# Patient Record
Sex: Male | Born: 1945
Health system: Southern US, Community
[De-identification: ages and names within clinical notes are randomized; demographics above are authoritative.]

## PROBLEM LIST (undated history)

## (undated) DIAGNOSIS — F32A Depression, unspecified: Secondary | ICD-10-CM

## (undated) DIAGNOSIS — M353 Polymyalgia rheumatica: Secondary | ICD-10-CM

## (undated) DIAGNOSIS — E785 Hyperlipidemia, unspecified: Secondary | ICD-10-CM

## (undated) DIAGNOSIS — I10 Essential (primary) hypertension: Secondary | ICD-10-CM

---

## 2017-02-09 DIAGNOSIS — H35033 Hypertensive retinopathy, bilateral: Secondary | ICD-10-CM | POA: Diagnosis not present

## 2017-04-24 DIAGNOSIS — Z23 Encounter for immunization: Secondary | ICD-10-CM | POA: Diagnosis not present

## 2017-08-30 DIAGNOSIS — M9901 Segmental and somatic dysfunction of cervical region: Secondary | ICD-10-CM | POA: Diagnosis not present

## 2017-08-30 DIAGNOSIS — M50322 Other cervical disc degeneration at C5-C6 level: Secondary | ICD-10-CM | POA: Diagnosis not present

## 2017-08-30 DIAGNOSIS — M9902 Segmental and somatic dysfunction of thoracic region: Secondary | ICD-10-CM | POA: Diagnosis not present

## 2017-08-30 DIAGNOSIS — M40292 Other kyphosis, cervical region: Secondary | ICD-10-CM | POA: Diagnosis not present

## 2017-08-31 DIAGNOSIS — M9901 Segmental and somatic dysfunction of cervical region: Secondary | ICD-10-CM | POA: Diagnosis not present

## 2017-08-31 DIAGNOSIS — M50322 Other cervical disc degeneration at C5-C6 level: Secondary | ICD-10-CM | POA: Diagnosis not present

## 2017-08-31 DIAGNOSIS — M40292 Other kyphosis, cervical region: Secondary | ICD-10-CM | POA: Diagnosis not present

## 2017-08-31 DIAGNOSIS — M9902 Segmental and somatic dysfunction of thoracic region: Secondary | ICD-10-CM | POA: Diagnosis not present

## 2017-09-02 DIAGNOSIS — M9901 Segmental and somatic dysfunction of cervical region: Secondary | ICD-10-CM | POA: Diagnosis not present

## 2017-09-02 DIAGNOSIS — M50322 Other cervical disc degeneration at C5-C6 level: Secondary | ICD-10-CM | POA: Diagnosis not present

## 2017-09-02 DIAGNOSIS — M40292 Other kyphosis, cervical region: Secondary | ICD-10-CM | POA: Diagnosis not present

## 2017-09-02 DIAGNOSIS — M9902 Segmental and somatic dysfunction of thoracic region: Secondary | ICD-10-CM | POA: Diagnosis not present

## 2017-09-06 DIAGNOSIS — M50322 Other cervical disc degeneration at C5-C6 level: Secondary | ICD-10-CM | POA: Diagnosis not present

## 2017-09-06 DIAGNOSIS — M9902 Segmental and somatic dysfunction of thoracic region: Secondary | ICD-10-CM | POA: Diagnosis not present

## 2017-09-06 DIAGNOSIS — M9901 Segmental and somatic dysfunction of cervical region: Secondary | ICD-10-CM | POA: Diagnosis not present

## 2017-09-06 DIAGNOSIS — M40292 Other kyphosis, cervical region: Secondary | ICD-10-CM | POA: Diagnosis not present

## 2017-09-07 DIAGNOSIS — M9902 Segmental and somatic dysfunction of thoracic region: Secondary | ICD-10-CM | POA: Diagnosis not present

## 2017-09-07 DIAGNOSIS — M9901 Segmental and somatic dysfunction of cervical region: Secondary | ICD-10-CM | POA: Diagnosis not present

## 2017-09-07 DIAGNOSIS — M50322 Other cervical disc degeneration at C5-C6 level: Secondary | ICD-10-CM | POA: Diagnosis not present

## 2017-09-07 DIAGNOSIS — M40292 Other kyphosis, cervical region: Secondary | ICD-10-CM | POA: Diagnosis not present

## 2017-09-09 DIAGNOSIS — M9901 Segmental and somatic dysfunction of cervical region: Secondary | ICD-10-CM | POA: Diagnosis not present

## 2017-09-09 DIAGNOSIS — M50322 Other cervical disc degeneration at C5-C6 level: Secondary | ICD-10-CM | POA: Diagnosis not present

## 2017-09-09 DIAGNOSIS — M9902 Segmental and somatic dysfunction of thoracic region: Secondary | ICD-10-CM | POA: Diagnosis not present

## 2017-09-09 DIAGNOSIS — M40292 Other kyphosis, cervical region: Secondary | ICD-10-CM | POA: Diagnosis not present

## 2017-09-13 DIAGNOSIS — M9902 Segmental and somatic dysfunction of thoracic region: Secondary | ICD-10-CM | POA: Diagnosis not present

## 2017-09-13 DIAGNOSIS — M50322 Other cervical disc degeneration at C5-C6 level: Secondary | ICD-10-CM | POA: Diagnosis not present

## 2017-09-13 DIAGNOSIS — M9901 Segmental and somatic dysfunction of cervical region: Secondary | ICD-10-CM | POA: Diagnosis not present

## 2017-09-13 DIAGNOSIS — M40292 Other kyphosis, cervical region: Secondary | ICD-10-CM | POA: Diagnosis not present

## 2017-09-15 DIAGNOSIS — M9901 Segmental and somatic dysfunction of cervical region: Secondary | ICD-10-CM | POA: Diagnosis not present

## 2017-09-15 DIAGNOSIS — M9902 Segmental and somatic dysfunction of thoracic region: Secondary | ICD-10-CM | POA: Diagnosis not present

## 2017-09-15 DIAGNOSIS — M50322 Other cervical disc degeneration at C5-C6 level: Secondary | ICD-10-CM | POA: Diagnosis not present

## 2017-09-15 DIAGNOSIS — M40292 Other kyphosis, cervical region: Secondary | ICD-10-CM | POA: Diagnosis not present

## 2017-09-16 DIAGNOSIS — M9902 Segmental and somatic dysfunction of thoracic region: Secondary | ICD-10-CM | POA: Diagnosis not present

## 2017-09-16 DIAGNOSIS — M9901 Segmental and somatic dysfunction of cervical region: Secondary | ICD-10-CM | POA: Diagnosis not present

## 2017-09-16 DIAGNOSIS — M40292 Other kyphosis, cervical region: Secondary | ICD-10-CM | POA: Diagnosis not present

## 2017-09-16 DIAGNOSIS — M50322 Other cervical disc degeneration at C5-C6 level: Secondary | ICD-10-CM | POA: Diagnosis not present

## 2017-09-20 DIAGNOSIS — M50322 Other cervical disc degeneration at C5-C6 level: Secondary | ICD-10-CM | POA: Diagnosis not present

## 2017-09-20 DIAGNOSIS — M9902 Segmental and somatic dysfunction of thoracic region: Secondary | ICD-10-CM | POA: Diagnosis not present

## 2017-09-20 DIAGNOSIS — M9901 Segmental and somatic dysfunction of cervical region: Secondary | ICD-10-CM | POA: Diagnosis not present

## 2017-09-20 DIAGNOSIS — M40292 Other kyphosis, cervical region: Secondary | ICD-10-CM | POA: Diagnosis not present

## 2017-09-21 DIAGNOSIS — M9901 Segmental and somatic dysfunction of cervical region: Secondary | ICD-10-CM | POA: Diagnosis not present

## 2017-09-21 DIAGNOSIS — M9902 Segmental and somatic dysfunction of thoracic region: Secondary | ICD-10-CM | POA: Diagnosis not present

## 2017-09-21 DIAGNOSIS — M50322 Other cervical disc degeneration at C5-C6 level: Secondary | ICD-10-CM | POA: Diagnosis not present

## 2017-09-21 DIAGNOSIS — M40292 Other kyphosis, cervical region: Secondary | ICD-10-CM | POA: Diagnosis not present

## 2017-09-22 DIAGNOSIS — M9901 Segmental and somatic dysfunction of cervical region: Secondary | ICD-10-CM | POA: Diagnosis not present

## 2017-09-22 DIAGNOSIS — M40292 Other kyphosis, cervical region: Secondary | ICD-10-CM | POA: Diagnosis not present

## 2017-09-22 DIAGNOSIS — M50322 Other cervical disc degeneration at C5-C6 level: Secondary | ICD-10-CM | POA: Diagnosis not present

## 2017-09-22 DIAGNOSIS — M9902 Segmental and somatic dysfunction of thoracic region: Secondary | ICD-10-CM | POA: Diagnosis not present

## 2017-09-29 DIAGNOSIS — M9901 Segmental and somatic dysfunction of cervical region: Secondary | ICD-10-CM | POA: Diagnosis not present

## 2017-09-29 DIAGNOSIS — M50322 Other cervical disc degeneration at C5-C6 level: Secondary | ICD-10-CM | POA: Diagnosis not present

## 2017-09-29 DIAGNOSIS — M9902 Segmental and somatic dysfunction of thoracic region: Secondary | ICD-10-CM | POA: Diagnosis not present

## 2017-09-29 DIAGNOSIS — M40292 Other kyphosis, cervical region: Secondary | ICD-10-CM | POA: Diagnosis not present

## 2017-10-06 DIAGNOSIS — M40292 Other kyphosis, cervical region: Secondary | ICD-10-CM | POA: Diagnosis not present

## 2017-10-06 DIAGNOSIS — M9901 Segmental and somatic dysfunction of cervical region: Secondary | ICD-10-CM | POA: Diagnosis not present

## 2017-10-06 DIAGNOSIS — M50322 Other cervical disc degeneration at C5-C6 level: Secondary | ICD-10-CM | POA: Diagnosis not present

## 2017-10-06 DIAGNOSIS — M9902 Segmental and somatic dysfunction of thoracic region: Secondary | ICD-10-CM | POA: Diagnosis not present

## 2017-10-12 DIAGNOSIS — M40292 Other kyphosis, cervical region: Secondary | ICD-10-CM | POA: Diagnosis not present

## 2017-10-12 DIAGNOSIS — M50322 Other cervical disc degeneration at C5-C6 level: Secondary | ICD-10-CM | POA: Diagnosis not present

## 2017-10-12 DIAGNOSIS — M9902 Segmental and somatic dysfunction of thoracic region: Secondary | ICD-10-CM | POA: Diagnosis not present

## 2017-10-12 DIAGNOSIS — M9901 Segmental and somatic dysfunction of cervical region: Secondary | ICD-10-CM | POA: Diagnosis not present

## 2017-11-08 DIAGNOSIS — F419 Anxiety disorder, unspecified: Secondary | ICD-10-CM | POA: Diagnosis not present

## 2017-11-08 DIAGNOSIS — E7800 Pure hypercholesterolemia, unspecified: Secondary | ICD-10-CM | POA: Diagnosis not present

## 2017-11-08 DIAGNOSIS — I10 Essential (primary) hypertension: Secondary | ICD-10-CM | POA: Diagnosis not present

## 2018-01-13 DIAGNOSIS — H35033 Hypertensive retinopathy, bilateral: Secondary | ICD-10-CM | POA: Diagnosis not present

## 2018-01-13 DIAGNOSIS — H2513 Age-related nuclear cataract, bilateral: Secondary | ICD-10-CM | POA: Diagnosis not present

## 2018-01-13 DIAGNOSIS — H35371 Puckering of macula, right eye: Secondary | ICD-10-CM | POA: Diagnosis not present

## 2018-01-13 DIAGNOSIS — H25013 Cortical age-related cataract, bilateral: Secondary | ICD-10-CM | POA: Diagnosis not present

## 2018-04-15 DIAGNOSIS — Z23 Encounter for immunization: Secondary | ICD-10-CM | POA: Diagnosis not present

## 2018-05-04 DIAGNOSIS — M67911 Unspecified disorder of synovium and tendon, right shoulder: Secondary | ICD-10-CM | POA: Diagnosis not present

## 2018-05-04 DIAGNOSIS — R29898 Other symptoms and signs involving the musculoskeletal system: Secondary | ICD-10-CM | POA: Diagnosis not present

## 2018-05-10 DIAGNOSIS — I10 Essential (primary) hypertension: Secondary | ICD-10-CM | POA: Diagnosis not present

## 2018-05-10 DIAGNOSIS — E7800 Pure hypercholesterolemia, unspecified: Secondary | ICD-10-CM | POA: Diagnosis not present

## 2018-05-10 DIAGNOSIS — R29898 Other symptoms and signs involving the musculoskeletal system: Secondary | ICD-10-CM | POA: Diagnosis not present

## 2018-05-10 DIAGNOSIS — F419 Anxiety disorder, unspecified: Secondary | ICD-10-CM | POA: Diagnosis not present

## 2018-05-10 DIAGNOSIS — H35033 Hypertensive retinopathy, bilateral: Secondary | ICD-10-CM | POA: Diagnosis not present

## 2018-05-10 DIAGNOSIS — M67911 Unspecified disorder of synovium and tendon, right shoulder: Secondary | ICD-10-CM | POA: Diagnosis not present

## 2018-05-10 DIAGNOSIS — Z79899 Other long term (current) drug therapy: Secondary | ICD-10-CM | POA: Diagnosis not present

## 2018-05-10 DIAGNOSIS — Z125 Encounter for screening for malignant neoplasm of prostate: Secondary | ICD-10-CM | POA: Diagnosis not present

## 2018-05-10 DIAGNOSIS — Z0001 Encounter for general adult medical examination with abnormal findings: Secondary | ICD-10-CM | POA: Diagnosis not present

## 2018-05-10 DIAGNOSIS — Z23 Encounter for immunization: Secondary | ICD-10-CM | POA: Diagnosis not present

## 2018-05-18 ENCOUNTER — Other Ambulatory Visit: Payer: Self-pay

## 2018-05-18 ENCOUNTER — Encounter: Payer: Self-pay | Admitting: Physical Therapy

## 2018-05-18 ENCOUNTER — Ambulatory Visit: Payer: Medicare Other | Attending: Family Medicine | Admitting: Physical Therapy

## 2018-05-18 DIAGNOSIS — M25512 Pain in left shoulder: Secondary | ICD-10-CM | POA: Diagnosis not present

## 2018-05-18 DIAGNOSIS — M25511 Pain in right shoulder: Secondary | ICD-10-CM | POA: Diagnosis not present

## 2018-05-18 DIAGNOSIS — M6281 Muscle weakness (generalized): Secondary | ICD-10-CM | POA: Diagnosis not present

## 2018-05-18 NOTE — Therapy (Signed)
[facility] [address] [place], [place], [address] Phone: [phone]   Fax:  [phone]  Physical Therapy Evaluation  Patient Details  Name: Devin Riggs MRN: [id number] Date of Birth: 04/07/1946 Referring Provider (PT): Darrow Bussing, MD   Encounter Date: 05/18/2018  PT End of Session - 05/18/18 1423    Visit Number  1    Date for PT Re-Evaluation  07/13/18    Authorization Type  Medicare [employer] supplement    Authorization Time Period  05/18/18-07/13/18    PT Start Time  1147    PT Stop Time  1230    PT Time Calculation (min)  43 min    Activity Tolerance  Patient tolerated treatment well    Behavior During Therapy  [facility] for tasks assessed/performed       History reviewed. No pertinent past medical history.  History reviewed. No pertinent surgical history.  There were no vitals filed for this visit.   Subjective Assessment - 05/18/18 1159    Subjective  Pt comes to PT with report of sudden onset of bil shoulder and leg stiffness and weakness beginning Oct 2019.  Prior to onset he has been very active with exercise at a gym.  Pt feels the stiffness is progressing.  Stiffness is worse with prolonged sitting > 15 min.  Attempts to stand from sitting are difficult due to stiffness and takes awhile to transition into comfortable walking.  Pt also reports difficulty with dressing due to shoulder stiffness.  Pt has arthitis in both hands and notes the Rt hand has been getting more stiff.  Pt finds some relief with moist heat via a hot shower, whirlpool which gives temporary relief, and ibuprofen.    Pertinent History  Lt shoulder dislocations many years ago    Limitations  Sitting;Lifting;Standing;Walking;House hold activities   initial walking from sitting   How long can you sit comfortably?  15 min    Diagnostic tests  none    Patient Stated Goals  understand what is going on, reduce stiffness, get  stronger, return to exercise with confidence    Currently in Pain?  Yes    Pain Score  4    can be a 10, and sharp   Pain Location  Shoulder    Pain Orientation  Left;Right    Pain Descriptors / Indicators  Tightness;Sharp    Pain Type  Acute pain    Pain Onset  More than a month ago    Pain Frequency  Intermittent    Aggravating Factors   overhead reaching, dressing    Pain Relieving Factors  moist heat, meds    Effect of Pain on Daily Activities  dressing, can't exercise    Multiple Pain Sites  Yes    Pain Score  5    Pain Location  --   bil thighs   Pain Orientation  Right;Left;Posterior;Medial    Pain Descriptors / Indicators  Tightness   weakness   Pain Type  Acute pain    Pain Onset  More than a month ago    Pain Frequency  Intermittent    Aggravating Factors   every time he rises from bed or a chair, especially after prolonged sitting    Pain Relieving Factors  moist heat, ibuprofen    Effect of Pain on Daily Activities  sit to stand, feeling steady on legs, prolonged sitting         [facility] PT Assessment - 05/18/18 0001  Assessment   Medical Diagnosis  R29.898 (ICD-10-CM) - Leg heaviness  M67.911 (ICD-10-CM) - Dysfunction of right rotator cuff    Referring Provider (PT)  Darrow Bussing, MD    Onset Date/Surgical Date  --   Oct 2019   Hand Dominance  Right    Prior Therapy  chiropractor, ongoing      Balance Screen   Has the patient fallen in the past 6 months  Yes    How many times?  1   at a pool   Has the patient had a decrease in activity level because of a fear of falling?   No    Is the patient reluctant to leave their home because of a fear of falling?   No      Home Environment   Living Environment  Private residence    Living Arrangements  Spouse/significant other    Type of Home  House    Home Access  Stairs to enter    Entrance Stairs-Number of Steps  3    Entrance Stairs-Rails  None    Home Layout  Two level    Alternate Level Stairs-Number  of Steps  10    Alternate Level Stairs-Rails  Left    Home Equipment  None      Prior Function   Level of Independence  Independent      Cognition   Overall Cognitive Status  Within Functional Limits for tasks assessed      Observation/Other Assessments   Focus on Therapeutic Outcomes (FOTO)   42   estimated 33%     ROM / Strength   AROM / PROM / Strength  AROM;Strength      AROM   Overall AROM   Within functional limits for tasks performed    Overall AROM Comments  grossly WNL bil LEs    AROM Assessment Site  Shoulder    Right/Left Shoulder  Left;Right    Right Shoulder Extension  5 Degrees   with anterior shoulder pain   Right Shoulder Flexion  170 Degrees   slow   Right Shoulder ABduction  150 Degrees    Right Shoulder Internal Rotation  5 Degrees   cannot perform IR behind back due to sharp ant shoulder pain   Right Shoulder External Rotation  50 Degrees    Left Shoulder Extension  5 Degrees   with anterior shoulder pain   Left Shoulder Flexion  160 Degrees   slow movement due to stiffness   Left Shoulder ABduction  150 Degrees   slow   Left Shoulder Internal Rotation  5 Degrees   cannot perform IR behind back due to sharp shoulder pain   Left Shoulder External Rotation  50 Degrees      Strength   Overall Strength  Within functional limits for tasks performed    Overall Strength Comments  5/5 bil LEs    Strength Assessment Site  Shoulder    Right/Left Shoulder  Right;Left    Right Shoulder Flexion  4+/5    Right Shoulder Extension  4+/5    Right Shoulder ABduction  4/5    Right Shoulder Internal Rotation  4+/5    Right Shoulder External Rotation  4/5    Left Shoulder Flexion  4+/5    Left Shoulder Extension  4/5    Left Shoulder ABduction  4/5    Left Shoulder Internal Rotation  4+/5    Left Shoulder External Rotation  4/5      Flexibility  Soft Tissue Assessment /Muscle Length  yes   hip flexors limited by 10 deg bil, adductors by 10 deg bil    Hamstrings  limited on Rt by 20 deg, Lt by 10 deg    Quadriceps  limited bil by 15 deg      Special Tests    Special Tests  Rotator Cuff Impingement    Rotator Cuff Impingment tests  Empty Can test      Empty Can test   Findings  Positive    Side  --   bil   Comment  pain anterior shoulder bil      Transfers   Transfers  Sit to Stand    Sit to Stand  7: Independent   slow to transition into hip and knee extension bil    Five time sit to stand comments   8      Ambulation/Gait   Ambulation/Gait  Yes    Assistive device  None    Gait Pattern  Step-through pattern    Gait Comments  slow start due to stiff hips/knees   once Pt gets moving no gait deviations present               Objective measurements completed on examination: See above findings.                PT Short Term Goals - 05/18/18 1458      PT SHORT TERM GOAL #1   Title  Pt will be ind in initial HEP to include ROM, shoulder stabilization, LE flexibility and strength.    Time  4    Period  Weeks    Status  New      PT SHORT TERM GOAL #2   Title  Pt will be able to sit at least 30 min with LE pain not to increase above 3/10.    Time  4    Period  Weeks    Status  New    Target Date  06/15/18      PT SHORT TERM GOAL #3   Title  Pt will be able to perform basic ADLs without increase in bil shoulder pain.    Time  4    Period  Weeks    Status  New    Target Date  06/15/18      PT SHORT TERM GOAL #4   Title  Pt will be able to return to at least 15 min of cardiovasular exercise at the gym at least 2x/week without increase in pain.    Time  8    Period  Weeks    Status  New    Target Date  07/13/18      PT SHORT TERM GOAL #5   Title  Pt will achieve 150 degrees bil shoulder flexion with pain rating < or = 3/10 to improve overhead functional task performance.    Time  4    Period  Weeks    Status  New    Target Date  06/15/18        PT Long Term Goals - 05/18/18 1435      PT  LONG TERM GOAL #1   Title  Pt will be ind in advanced HEP to improve strength, ROM and functional independence with less pain.    Time  8    Period  Weeks    Status  New    Target Date  07/13/18      PT LONG TERM  GOAL #2   Title  Pt will achieve FOTO score to 33% or less to demonstrate reduced functional limitations.    Time  8    Period  Weeks    Status  New    Target Date  07/13/18      PT LONG TERM GOAL #3   Title  Pt will achieve shoulder extension and functional IR (behind back) to at least 15 degrees to improve UE use for dressing and other ADLs.    Time  8    Period  Weeks    Status  New    Target Date  07/13/18      PT LONG TERM GOAL #4   Title  Pt will achieve LE flexibility to within at least 10 degrees of normal limits bilaterally to reduce stress across hips and knees.    Time  8    Period  Weeks    Status  New    Target Date  07/13/18      PT LONG TERM GOAL #5   Title  Pt will be able to tolerate return to at least 30 min of walking or cardiovascular exercise without increase in pain.    Time  8    Period  Weeks    Status  New    Target Date  07/13/18             Plan - 05/18/18 1424    Clinical Impression Statement  Pt presents to PT with concerns over sudden onset of bil shoulder stiffness/pain and unsteadiness on feet approx. 1 month ago.  Prior to onset, Pt reports he was very active and exercised at the gym regularly.  Much of PT eval was spent with Pt describing sudden changes and current state compared to prior to onset.  He has had a significant decrease in activity level due to be concerned about this sudden change.  He is awaiting blood work to be taken by referring MD next week to check for signs of arthritis.  Shoulder pain is sharp and worse with attempts to reach behind back into IR and extension which limits functional tasks such as dressing and ADLs.  Empty can test was positive bil for pain, but Pt is non-tender to palpation at rotator cuff  tendons bil.  Pt has limited bil shoulder ROM and is slow to perform overhead movements due to stiffness.  He has anterior shoulder pain when laying on his back and feels they are "unstable."  History includes several Lt shoulder dislocations 30+ years ago and he is guarded in this shoulder with attempts at P/ROM.  Overall UE strength ranges from 4-4+/5.  Bil LE strength is 5/5 throughout. 5x sit to stand was performed in 8 sec.  Pt has slow transitions from sit to stand into initial gait due to stiffness in bil hips and knees after prolonged sitting.  Flexibility is limited bil in hip flexors, quads, hamstrings Rt>Lt and adductors.  Pt has a history of seeing a chiropractor 1x/month.  Pt will benefit from ongoing assessment given sudden change in activity level, follow up on blood work results, and skilled PT to address ROM, stability of bil shoulders, LE strength and balance, functional training and Pt education on safe return to exercise to address pain and increase functional independence.     History and Personal Factors relevant to plan of care:  History of multiple occurences of Lt shoulder dislocation 30+ years ago, sudden onset global stiffness and unsteadiness on feet  approx 1 month ago    Clinical Presentation  Stable    Clinical Presentation due to:  bil shoulder pain and stiffness, unsteadiness on feet    Clinical Decision Making  Low    Rehab Potential  Good    Clinical Impairments Affecting Rehab Potential  sudden onset stiffness and pain bil shoulders, unsteadiness on feet approx 1 month ago, Pt fear/concern over activity level limitations due to this sudden change    PT Frequency  2x / week    PT Duration  8 weeks    PT Treatment/Interventions  ADLs/Self Care Home Management;Cryotherapy;Electrical Stimulation;Moist Heat;Iontophoresis 4mg /ml Dexamethasone;Traction;Gait training;Stair training;Functional mobility training;Therapeutic activities;Therapeutic exercise;Balance  training;Neuromuscular re-education;Patient/family education;Manual techniques;Passive range of motion;Dry needling;Spinal Manipulations;Joint Manipulations;Taping;Vasopneumatic Device    PT Next Visit Plan  begin HEP, shoulder A/AROM, shoulder stabilization and strength as tolerated, LE strength/ROM/balance/stretches (hams, adductors, hip flexors, quads)    PT Home Exercise Plan  start HEP    Consulted and Agree with Plan of Care  Patient       Patient will benefit from skilled therapeutic intervention in order to improve the following deficits and impairments:  Abnormal gait, Improper body mechanics, Pain, Decreased mobility, Postural dysfunction, Decreased activity tolerance, Decreased range of motion, Decreased strength, Hypomobility, Impaired UE functional use, Decreased balance, Difficulty walking, Impaired flexibility  Visit Diagnosis: Acute pain of left shoulder - Plan: PT plan of care cert/re-cert  Acute pain of right shoulder - Plan: PT plan of care cert/re-cert  Muscle weakness (generalized) - Plan: PT plan of care cert/re-cert     Problem List There are no active problems to display for this patient.  Loistine Simas Beuhring, PT 05/18/18 3:09 PM   [facility] [address], [address] [place], [place], [address] Phone: [phone]   Fax:  [phone]  Name: Devin Riggs MRN: [id number] Date of Birth: 29-Jun-1946

## 2018-05-20 ENCOUNTER — Encounter: Payer: Self-pay | Admitting: Physical Therapy

## 2018-05-20 ENCOUNTER — Ambulatory Visit: Payer: Medicare Other | Admitting: Physical Therapy

## 2018-05-20 DIAGNOSIS — M25512 Pain in left shoulder: Secondary | ICD-10-CM | POA: Diagnosis not present

## 2018-05-20 DIAGNOSIS — M25511 Pain in right shoulder: Secondary | ICD-10-CM

## 2018-05-20 DIAGNOSIS — M6281 Muscle weakness (generalized): Secondary | ICD-10-CM | POA: Diagnosis not present

## 2018-05-20 NOTE — Patient Instructions (Signed)
Access Code: [email or web address]  URL: [email or web address]  Date: 05/20/2018  Prepared by: Lavinia SharpsStacy Cathye Kreiter   Exercises  Supine Shoulder Flexion with Dowel - 10 reps - 1 sets - 5 hold - 1x daily - 7x weekly  Supine Hamstring Stretch with Strap - 13 reps - 1 sets - 30 hold - 1x daily - 7x weekly  Supine Hip External Rotation Stretch - 3 reps - 1 sets - 30 hold - 1x daily - 7x weekly  Bent Knee Fallouts - 3 reps - 1 sets - 30 hold - 1x daily - 7x weekly  Supine Lower Trunk Rotation - 3 reps - 1 sets - 30 hold - 1x daily - 7x weekly  Hip Flexor Stretch at Edge of Bed - 3 reps - 1 sets - 30 hold - 1x daily - 7x weekly  Supine Bridge - 10 reps - 1 sets - 2 hold - 1x daily - 7x weekly

## 2018-05-20 NOTE — Therapy (Signed)
[facility] [address], [address] [place], [place], [address] Phone: [phone]   Fax:  [phone]  Physical Therapy Treatment  Patient Details  Name: Devin Riggs MRN: [id number] Date of Birth: 06/10/1946 Referring Provider (PT): Darrow Bussing, MD   Encounter Date: 05/20/2018  PT End of Session - 05/20/18 1211    Visit Number  2    Date for PT Re-Evaluation  07/13/18    Authorization Type  Medicare [employer] supplement    Authorization Time Period  05/18/18-07/13/18    PT Start Time  1015    PT Stop Time  1058    PT Time Calculation (min)  43 min    Activity Tolerance  Patient tolerated treatment well       History reviewed. No pertinent past medical history.  History reviewed. No pertinent surgical history.  There were no vitals filed for this visit.  Subjective Assessment - 05/20/18 1015    Subjective  Awaiting blood work results Tuesday.  Both shoulders and hips very stiff.  Reaching toward back pocket 5/10 pain.  Pulling up pants is painful.      Currently in Pain?  Yes    Pain Score  5     Pain Location  Shoulder    Pain Orientation  Right    Pain Relieving Factors  moist heat, ibuprofen    Pain Score  5    Pain Location  Hip    Pain Orientation  Right;Left                       [facility] Adult PT Treatment/Exercise - 05/20/18 0001      Knee/Hip Exercises: Stretches   Active Hamstring Stretch  Right;Left;3 reps;30 seconds    Active Hamstring Stretch Limitations  supine with strap    Hip Flexor Stretch  Right;Left;3 reps;30 seconds    Hip Flexor Stretch Limitations  supine over the side of the bed    Piriformis Stretch  Right;Left;2 reps;30 seconds    Other Knee/Hip Stretches  lumbar rotation 3x 30 sec holds    Other Knee/Hip Stretches  bent knee fallouts 3x 30 sec holds      Knee/Hip Exercises: Supine   Bridges  Right;Left;10 reps      Shoulder Exercises: Supine   Flexion   AAROM;Both;10 reps    Flexion Limitations  with cane             PT Education - 05/20/18 1049    Education Details   Access Code: [email or web address] cane flexion; LE stretches    Person(s) Educated  Patient    Methods  Explanation;Demonstration;Handout    Comprehension  Verbalized understanding;Returned demonstration       PT Short Term Goals - 05/18/18 1458      PT SHORT TERM GOAL #1   Title  Pt will be ind in initial HEP to include ROM, shoulder stabilization, LE flexibility and strength.    Time  4    Period  Weeks    Status  New      PT SHORT TERM GOAL #2   Title  Pt will be able to sit at least 30 min with LE pain not to increase above 3/10.    Time  4    Period  Weeks    Status  New    Target Date  06/15/18      PT SHORT TERM GOAL #3   Title  Pt will be able to  perform basic ADLs without increase in bil shoulder pain.    Time  4    Period  Weeks    Status  New    Target Date  06/15/18      PT SHORT TERM GOAL #4   Title  Pt will be able to return to at least 15 min of cardiovasular exercise at the gym at least 2x/week without increase in pain.    Time  8    Period  Weeks    Status  New    Target Date  07/13/18      PT SHORT TERM GOAL #5   Title  Pt will achieve 150 degrees bil shoulder flexion with pain rating < or = 3/10 to improve overhead functional task performance.    Time  4    Period  Weeks    Status  New    Target Date  06/15/18        PT Long Term Goals - 05/18/18 1435      PT LONG TERM GOAL #1   Title  Pt will be ind in advanced HEP to improve strength, ROM and functional independence with less pain.    Time  8    Period  Weeks    Status  New    Target Date  07/13/18      PT LONG TERM GOAL #2   Title  Pt will achieve FOTO score to 33% or less to demonstrate reduced functional limitations.    Time  8    Period  Weeks    Status  New    Target Date  07/13/18      PT LONG TERM GOAL #3   Title  Pt will achieve shoulder extension and  functional IR (behind back) to at least 15 degrees to improve UE use for dressing and other ADLs.    Time  8    Period  Weeks    Status  New    Target Date  07/13/18      PT LONG TERM GOAL #4   Title  Pt will achieve LE flexibility to within at least 10 degrees of normal limits bilaterally to reduce stress across hips and knees.    Time  8    Period  Weeks    Status  New    Target Date  07/13/18      PT LONG TERM GOAL #5   Title  Pt will be able to tolerate return to at least 30 min of walking or cardiovascular exercise without increase in pain.    Time  8    Period  Weeks    Status  New    Target Date  07/13/18            Plan - 05/20/18 1211    Clinical Impression Statement  The patient complains of continued bil shoulder stiffness and hip stiffness but he is particularly concerned about left shoulder instability.  He reports it feels unstable even with lying supine but feels much better with a towel roll behind his shoulder.  Initiated low level mobility ex's and a HEP.  Therapist closely monitoring response and providing verbal cues for technique.  He reports following treatment session that his legs "feel much better,"      Rehab Potential  Good    Clinical Impairments Affecting Rehab Potential  sudden onset stiffness and pain bil shoulders, unsteadiness on feet approx 1 month ago, Pt fear/concern over activity level limitations due to  this sudden change    PT Frequency  2x / week    PT Duration  8 weeks    PT Treatment/Interventions  ADLs/Self Care Home Management;Cryotherapy;Electrical Stimulation;Moist Heat;Iontophoresis 4mg /ml Dexamethasone;Traction;Gait training;Stair training;Functional mobility training;Therapeutic activities;Therapeutic exercise;Balance training;Neuromuscular re-education;Patient/family education;Manual techniques;Passive range of motion;Dry needling;Spinal Manipulations;Joint Manipulations;Taping;Vasopneumatic Device    PT Next Visit Plan  review HEP  as needed;  add shoulder isometrics for left;  add clams and core;  Nu-Step ; towel roll behind shoulder in supine    PT Home Exercise Plan  [email or web address]       Patient will benefit from skilled therapeutic intervention in order to improve the following deficits and impairments:  Abnormal gait, Improper body mechanics, Pain, Decreased mobility, Postural dysfunction, Decreased activity tolerance, Decreased range of motion, Decreased strength, Hypomobility, Impaired UE functional use, Decreased balance, Difficulty walking, Impaired flexibility  Visit Diagnosis: Acute pain of left shoulder  Acute pain of right shoulder  Muscle weakness (generalized)     Problem List There are no active problems to display for this patient.  Lavinia Sharps, PT 05/20/18 12:20 PM Phone: [phone] Fax: [phone]  Vivien Presto 05/20/2018, 12:20 PM   [facility] [address], [address] [place], [place], [address] Phone: [phone]   Fax:  [phone]  Name: Tomer Chalmers MRN: [id number] Date of Birth: [birth year]

## 2018-05-23 DIAGNOSIS — R972 Elevated prostate specific antigen [PSA]: Secondary | ICD-10-CM | POA: Diagnosis not present

## 2018-05-23 DIAGNOSIS — M25559 Pain in unspecified hip: Secondary | ICD-10-CM | POA: Diagnosis not present

## 2018-05-24 ENCOUNTER — Encounter: Payer: Self-pay | Admitting: Physical Therapy

## 2018-05-24 ENCOUNTER — Ambulatory Visit: Payer: Medicare Other | Admitting: Physical Therapy

## 2018-05-24 DIAGNOSIS — M25512 Pain in left shoulder: Secondary | ICD-10-CM | POA: Diagnosis not present

## 2018-05-24 DIAGNOSIS — M25511 Pain in right shoulder: Secondary | ICD-10-CM | POA: Diagnosis not present

## 2018-05-24 DIAGNOSIS — M6281 Muscle weakness (generalized): Secondary | ICD-10-CM | POA: Diagnosis not present

## 2018-05-24 NOTE — Patient Instructions (Signed)
Access Code: [email or web address]  URL: [email or web address]  Date: 05/24/2018  Prepared by: Lavinia SharpsStacy Teliah Buffalo   Exercises  Supine Shoulder Flexion with Dowel - 10 reps - 1 sets - 5 hold - 1x daily - 7x weekly  Supine Hamstring Stretch with Strap - 13 reps - 1 sets - 30 hold - 1x daily - 7x weekly  Supine Hip External Rotation Stretch - 3 reps - 1 sets - 30 hold - 1x daily - 7x weekly  Bent Knee Fallouts - 3 reps - 1 sets - 30 hold - 1x daily - 7x weekly  Supine Lower Trunk Rotation - 3 reps - 1 sets - 30 hold - 1x daily - 7x weekly  Hip Flexor Stretch at Edge of Bed - 3 reps - 1 sets - 30 hold - 1x daily - 7x weekly  Supine Bridge - 10 reps - 1 sets - 2 hold - 1x daily - 7x weekly  Hooklying Transversus Abdominis Palpation - 10 reps - 1 sets - 1x daily - 7x weekly  Hooklying Isometric Hip Flexion - 10 reps - 1 sets - 1x daily - 7x weekly  Supine 90/90 Shoulder Flexion with Abdominal Bracing - 10 reps - 1 sets - 1x daily - 7x weekly  Clamshell with Resistance - 10 reps - 1 sets - 1x daily - 7x weekly  Standing Isometric Shoulder Flexion with Doorway - 10 reps - 1 sets - 5 hold - 1x daily - 7x weekly  Isometric Shoulder Abduction - Arm Straight at Wall - 10 reps - 1 sets - 5 hold - 1x daily - 7x weekly  Standing Isometric Shoulder Extension with Doorway - 10 reps - 1 sets - 1x daily - 7x weekly

## 2018-05-24 NOTE — Therapy (Signed)
[facility] [address], [address] [place], [place], [address] Phone: [phone]   Fax:  [phone]  Physical Therapy Treatment  Patient Details  Name: Devin BeneMiddleton Bonifield MRN: [id number] Date of Birth: 04/10/1946 Referring Provider (PT): Darrow BussingKoirala, Dibas, MD   Encounter Date: 05/24/2018  PT End of Session - 05/24/18 1853    Visit Number  3    Date for PT Re-Evaluation  07/13/18    Authorization Type  Medicare [employer] supplement    Authorization Time Period  05/18/18-07/13/18    PT Start Time  1015    PT Stop Time  1100    PT Time Calculation (min)  45 min    Activity Tolerance  Patient tolerated treatment well       History reviewed. No pertinent past medical history.  History reviewed. No pertinent surgical history.  There were no vitals filed for this visit.  Subjective Assessment - 05/24/18 1018    Subjective  I can't get comfortable at night b/c of shoulders and hips.  Ibuprofen only lasts 4 hours.  I'm so tight.  I can reach behind my back now.  I expect to get bloodwork results in a day or 2.  I"ve been doing exercises faithfully.  I really like this ([employer]) ex program.      Pertinent History  Lt shoulder dislocations many years ago    Limitations  Sitting;Lifting;Standing;Walking;House hold activities    Currently in Pain?  Yes    Pain Score  6     Pain Location  Shoulder    Pain Orientation  Right    Pain Type  Acute pain    Aggravating Factors   AM, not sleeping well at night     Pain Score  6    Pain Location  Hip    Pain Orientation  Right;Left    Pain Type  Acute pain                       [facility] Adult PT Treatment/Exercise - 05/24/18 0001      Exercises   Exercises  --   verbal review of previous HEP     Lumbar Exercises: Stretches   Active Hamstring Stretch  Right;Left;3 reps;30 seconds    Active Hamstring Stretch Limitations  with strap with bias to adduction and  abduction     Active Hamstring Stretch  Right;Left;3 reps;30 seconds    Active Hamstring Stretch Limitations  supine with strap with bias       Lumbar Exercises: Supine   Ab Set  10 reps    Bent Knee Raise  10 reps    Isometric Hip Flexion  10 reps      Lumbar Exercises: Sidelying   Clam  Right;Left;20 reps    Clam Limitations  red band       Shoulder Exercises: Isometric Strengthening   Flexion  5X5"    Extension  5X5"    ABduction  5X5"             PT Education - 05/24/18 1846    Education Details  [email or web address]  shoulder isometrics;  abdominal brace series, clams with red band     Person(s) Educated  Patient    Methods  Explanation;Demonstration;Handout    Comprehension  Returned demonstration;Verbalized understanding       PT Short Term Goals - 05/18/18 1458      PT SHORT TERM GOAL #1   Title  Pt will be  ind in initial HEP to include ROM, shoulder stabilization, LE flexibility and strength.    Time  4    Period  Weeks    Status  New      PT SHORT TERM GOAL #2   Title  Pt will be able to sit at least 30 min with LE pain not to increase above 3/10.    Time  4    Period  Weeks    Status  New    Target Date  06/15/18      PT SHORT TERM GOAL #3   Title  Pt will be able to perform basic ADLs without increase in bil shoulder pain.    Time  4    Period  Weeks    Status  New    Target Date  06/15/18      PT SHORT TERM GOAL #4   Title  Pt will be able to return to at least 15 min of cardiovasular exercise at the gym at least 2x/week without increase in pain.    Time  8    Period  Weeks    Status  New    Target Date  07/13/18      PT SHORT TERM GOAL #5   Title  Pt will achieve 150 degrees bil shoulder flexion with pain rating < or = 3/10 to improve overhead functional task performance.    Time  4    Period  Weeks    Status  New    Target Date  06/15/18        PT Long Term Goals - 05/18/18 1435      PT LONG TERM GOAL #1   Title  Pt will be ind in  advanced HEP to improve strength, ROM and functional independence with less pain.    Time  8    Period  Weeks    Status  New    Target Date  07/13/18      PT LONG TERM GOAL #2   Title  Pt will achieve FOTO score to 33% or less to demonstrate reduced functional limitations.    Time  8    Period  Weeks    Status  New    Target Date  07/13/18      PT LONG TERM GOAL #3   Title  Pt will achieve shoulder extension and functional IR (behind back) to at least 15 degrees to improve UE use for dressing and other ADLs.    Time  8    Period  Weeks    Status  New    Target Date  07/13/18      PT LONG TERM GOAL #4   Title  Pt will achieve LE flexibility to within at least 10 degrees of normal limits bilaterally to reduce stress across hips and knees.    Time  8    Period  Weeks    Status  New    Target Date  07/13/18      PT LONG TERM GOAL #5   Title  Pt will be able to tolerate return to at least 30 min of walking or cardiovascular exercise without increase in pain.    Time  8    Period  Weeks    Status  New    Target Date  07/13/18            Plan - 05/24/18 1038    Clinical Impression Statement  The patient demonstrates compliance with initial  HEP and good initial response.  He continues to be frustrated by the onset of global stiffness 1 month ago and is awaiting results of bloodwork for an explanation.   He needs cues to avoid holding his breath when activating transverse abdominal muscles and to avoid rotating his trunk with clam exercises.  Therapist closely monitoring response.  He denies pain with any of the ex.      Rehab Potential  Good    Clinical Impairments Affecting Rehab Potential  sudden onset stiffness and pain bil shoulders, unsteadiness on feet approx 1 month ago, Pt fear/concern over activity level limitations due to this sudden change    PT Frequency  2x / week    PT Duration  8 weeks    PT Treatment/Interventions  ADLs/Self Care Home  Management;Cryotherapy;Electrical Stimulation;Moist Heat;Iontophoresis 4mg /ml Dexamethasone;Traction;Gait training;Stair training;Functional mobility training;Therapeutic activities;Therapeutic exercise;Balance training;Neuromuscular re-education;Patient/family education;Manual techniques;Passive range of motion;Dry needling;Spinal Manipulations;Joint Manipulations;Taping;Vasopneumatic Device    PT Next Visit Plan  review HEP as needed;   try Nu-Step ; initiate low level standing ex's;  Try standing hip flexor stretch;   gradual progression of ex;  towel roll behind left shoulder in supine for comfort and support;   see if patient has results of bloodwork    PT Home Exercise Plan  [email or web address]       Patient will benefit from skilled therapeutic intervention in order to improve the following deficits and impairments:  Abnormal gait, Improper body mechanics, Pain, Decreased mobility, Postural dysfunction, Decreased activity tolerance, Decreased range of motion, Decreased strength, Hypomobility, Impaired UE functional use, Decreased balance, Difficulty walking, Impaired flexibility  Visit Diagnosis: Acute pain of left shoulder  Acute pain of right shoulder  Muscle weakness (generalized)     Problem List There are no active problems to display for this patient.  Lavinia Sharps, PT 05/24/18 7:13 PM Phone: [phone] Fax: [phone] Vivien Presto 05/24/2018, 7:12 PM  [facility] [address] [place], [place], [address] Phone: [phone]   Fax:  [phone]  Name: Rube Sanchez MRN: [id number] Date of Birth: 06-Dec-1945

## 2018-05-25 ENCOUNTER — Encounter

## 2018-05-31 DIAGNOSIS — M25559 Pain in unspecified hip: Secondary | ICD-10-CM | POA: Diagnosis not present

## 2018-05-31 DIAGNOSIS — R972 Elevated prostate specific antigen [PSA]: Secondary | ICD-10-CM | POA: Diagnosis not present

## 2018-06-02 ENCOUNTER — Ambulatory Visit: Payer: Medicare Other | Attending: Family Medicine | Admitting: Physical Therapy

## 2018-06-02 ENCOUNTER — Encounter: Payer: Self-pay | Admitting: Physical Therapy

## 2018-06-02 DIAGNOSIS — M25511 Pain in right shoulder: Secondary | ICD-10-CM

## 2018-06-02 DIAGNOSIS — M25512 Pain in left shoulder: Secondary | ICD-10-CM | POA: Diagnosis not present

## 2018-06-02 DIAGNOSIS — M6281 Muscle weakness (generalized): Secondary | ICD-10-CM | POA: Insufficient documentation

## 2018-06-02 NOTE — Patient Instructions (Signed)
Access Code: [email or web address]  URL: [email or web address]  Date: 06/02/2018  Prepared by: Lavinia SharpsStacy Simpson   Exercises  Supine Shoulder Flexion with Dowel - 10 reps - 1 sets - 5 hold - 1x daily - 7x weekly  Supine Hamstring Stretch with Strap - 13 reps - 1 sets - 30 hold - 1x daily - 7x weekly  Supine Hip External Rotation Stretch - 3 reps - 1 sets - 30 hold - 1x daily - 7x weekly  Bent Knee Fallouts - 3 reps - 1 sets - 30 hold - 1x daily - 7x weekly  Supine Lower Trunk Rotation - 3 reps - 1 sets - 30 hold - 1x daily - 7x weekly  Hip Flexor Stretch at Edge of Bed - 3 reps - 1 sets - 30 hold - 1x daily - 7x weekly  Supine Bridge - 10 reps - 1 sets - 2 hold - 1x daily - 7x weekly  Hooklying Transversus Abdominis Palpation - 10 reps - 1 sets - 1x daily - 7x weekly  Hooklying Isometric Hip Flexion - 10 reps - 1 sets - 1x daily - 7x weekly  Supine 90/90 Shoulder Flexion with Abdominal Bracing - 10 reps - 1 sets - 1x daily - 7x weekly  Clamshell with Resistance - 10 reps - 1 sets - 1x daily - 7x weekly  Standing Isometric Shoulder Flexion with Doorway - 10 reps - 1 sets - 5 hold - 1x daily - 7x weekly  Isometric Shoulder Abduction - Arm Straight at Wall - 10 reps - 1 sets - 5 hold - 1x daily - 7x weekly  Standing Isometric Shoulder Extension with Doorway - 10 reps - 1 sets - 1x daily - 7x weekly  Prone Hip Extension - Two Pillows - 10 reps - 1 sets - 1x daily - 7x weekly  Hip Extension with Resistance Loop - 10 reps - 1 sets - 1x daily - 7x weekly  Hip Abduction with Resistance Loop - 10 reps - 1 sets - 1x daily - 7x weekly

## 2018-06-02 NOTE — Therapy (Addendum)
[facility] [address] [place], [place], [address] Phone: [phone]   Fax:  [phone]  Physical Therapy Treatment/Discharge Summary   Patient Details  Name: Devin Riggs MRN: [id number] Date of Birth: 04/20/1946 Referring Provider (PT): Lujean Amel, MD   Encounter Date: 06/02/2018  PT End of Session - 06/02/18 1311    Visit Number  4    Date for PT Re-Evaluation  07/13/18    Authorization Type  Medicare [employer] supplement    Authorization Time Period  05/18/18-07/13/18    PT Start Time  1231    PT Stop Time  1315    PT Time Calculation (min)  44 min    Activity Tolerance  Patient tolerated treatment well       History reviewed. No pertinent past medical history.  History reviewed. No pertinent surgical history.  There were no vitals filed for this visit.  Subjective Assessment - 06/02/18 1232    Subjective  Got bloodwork back + for rheumatoid arthritis.  I have my life back now.  Had 5 days of prednisone.  I can move much easier now.   I can put on my belt and shirt now.  I've been exercising for 2 days.  I moved some 20# boxes today.   Before the shoulder isometrics.      Pertinent History  Lt shoulder dislocations many years ago    Limitations  Sitting;Lifting;Standing;Walking;House hold activities    Currently in Pain?  No/denies    Pain Score  0-No pain                       [facility] Adult PT Treatment/Exercise - 06/02/18 0001      Lumbar Exercises: Aerobic   Nustep  L2 8 min while discussing HEP and response seat 10       Lumbar Exercises: Supine   Ab Set  5 reps    Bent Knee Raise  5 reps    Isometric Hip Flexion  5 reps      Lumbar Exercises: Sidelying   Clam  Right;Left;20 reps    Clam Limitations  red band       Lumbar Exercises: Prone   Other Prone Lumbar Exercises  over 2 pillows hip extension with pelvic press 10x right/left       Knee/Hip Exercises:  Standing   Hip Abduction  Stengthening;Right;Left;10 reps    Abduction Limitations  red band    Hip Extension  Stengthening;Right;Left;10 reps    Extension Limitations  red band             PT Education - 06/02/18 1308    Education Details   Access Code: [email or web address] prone hip extension;  red band standing hip extension and abduction    Person(s) Educated  Patient    Methods  Explanation;Handout;Demonstration    Comprehension  Verbalized understanding;Returned demonstration       PT Short Term Goals - 05/18/18 1458      PT SHORT TERM GOAL #1   Title  Pt will be ind in initial HEP to include ROM, shoulder stabilization, LE flexibility and strength.    Time  4    Period  Weeks    Status  New      PT SHORT TERM GOAL #2   Title  Pt will be able to sit at least 30 min with LE pain not to increase above 3/10.    Time  4  Period  Weeks    Status  New    Target Date  06/15/18      PT SHORT TERM GOAL #3   Title  Pt will be able to perform basic ADLs without increase in bil shoulder pain.    Time  4    Period  Weeks    Status  New    Target Date  06/15/18      PT SHORT TERM GOAL #4   Title  Pt will be able to return to at least 15 min of cardiovasular exercise at the gym at least 2x/week without increase in pain.    Time  8    Period  Weeks    Status  New    Target Date  07/13/18      PT SHORT TERM GOAL #5   Title  Pt will achieve 150 degrees bil shoulder flexion with pain rating < or = 3/10 to improve overhead functional task performance.    Time  4    Period  Weeks    Status  New    Target Date  06/15/18        PT Long Term Goals - 05/18/18 1435      PT LONG TERM GOAL #1   Title  Pt will be ind in advanced HEP to improve strength, ROM and functional independence with less pain.    Time  8    Period  Weeks    Status  New    Target Date  07/13/18      PT LONG TERM GOAL #2   Title  Pt will achieve FOTO score to 33% or less to demonstrate reduced functional  limitations.    Time  8    Period  Weeks    Status  New    Target Date  07/13/18      PT LONG TERM GOAL #3   Title  Pt will achieve shoulder extension and functional IR (behind back) to at least 15 degrees to improve UE use for dressing and other ADLs.    Time  8    Period  Weeks    Status  New    Target Date  07/13/18      PT LONG TERM GOAL #4   Title  Pt will achieve LE flexibility to within at least 10 degrees of normal limits bilaterally to reduce stress across hips and knees.    Time  8    Period  Weeks    Status  New    Target Date  07/13/18      PT LONG TERM GOAL #5   Title  Pt will be able to tolerate return to at least 30 min of walking or cardiovascular exercise without increase in pain.    Time  8    Period  Weeks    Status  New    Target Date  07/13/18            Plan - 06/02/18 1300    Clinical Impression Statement  The patient reports a significant improvement in pain and reduction in stiffness since he had a round a prednisone.   He is able to participate in a progression of low level strengthening without exacerbation of pain.  Therapist closely monitoring for pain and also providing verbal cues to avoid holding his breath.  On track to meet STGs.      Rehab Potential  Good    Clinical Impairments Affecting Rehab Potential  sudden  onset stiffness and pain bil shoulders, unsteadiness on feet approx 1 month ago, Pt fear/concern over activity level limitations due to this sudden change    PT Frequency  2x / week    PT Duration  8 weeks    PT Treatment/Interventions  ADLs/Self Care Home Management;Cryotherapy;Electrical Stimulation;Moist Heat;Iontophoresis '4mg'$ /ml Dexamethasone;Traction;Gait training;Stair training;Functional mobility training;Therapeutic activities;Therapeutic exercise;Balance training;Neuromuscular re-education;Patient/family education;Manual techniques;Passive range of motion;Dry needling;Spinal Manipulations;Joint  Manipulations;Taping;Vasopneumatic Device    PT Next Visit Plan  review HEP as needed; continue Nu-Step ;  standing ex's;  gradual progression of ex;  towel roll behind left shoulder in supine for comfort and support    PT Home Exercise Plan  [email or web address]       Patient will benefit from skilled therapeutic intervention in order to improve the following deficits and impairments:  Abnormal gait, Improper body mechanics, Pain, Decreased mobility, Postural dysfunction, Decreased activity tolerance, Decreased range of motion, Decreased strength, Hypomobility, Impaired UE functional use, Decreased balance, Difficulty walking, Impaired flexibility  Visit Diagnosis: Acute pain of left shoulder  Acute pain of right shoulder  Muscle weakness (generalized)  PHYSICAL THERAPY DISCHARGE SUMMARY  Visits from Start of Care: 4  Current functional level related to goals / functional outcomes: The patient called to cancel his remaining PT appointments.    He states he has been diagnosed with polymyalgia rheumatica and the doctor told him PT would not help with this.     Remaining deficits:  As above  Education / Equipment: Basic HEP Plan: Patient agrees to discharge.  Patient goals were partially met. Patient is being discharged due to the patient's request.  ?????         Problem List There are no active problems to display for this patient.  Ruben Im, PT 06/02/18 2:18 PM Phone: [phone] Fax: [phone]  Alvera Singh 06/02/2018, 2:18 PM  [facility] [address] [place], [place], [address] Phone: [phone]   Fax:  [phone]  Name: Devin Riggs MRN: [id number] Date of Birth: 22-Jul-1945

## 2018-06-03 ENCOUNTER — Encounter

## 2018-06-07 ENCOUNTER — Encounter: Payer: Medicare Other | Admitting: Physical Therapy

## 2018-06-07 DIAGNOSIS — R5382 Chronic fatigue, unspecified: Secondary | ICD-10-CM | POA: Diagnosis not present

## 2018-06-07 DIAGNOSIS — M2550 Pain in unspecified joint: Secondary | ICD-10-CM | POA: Diagnosis not present

## 2018-06-07 DIAGNOSIS — R768 Other specified abnormal immunological findings in serum: Secondary | ICD-10-CM | POA: Diagnosis not present

## 2018-06-07 DIAGNOSIS — E663 Overweight: Secondary | ICD-10-CM | POA: Diagnosis not present

## 2018-06-07 DIAGNOSIS — Z6826 Body mass index (BMI) 26.0-26.9, adult: Secondary | ICD-10-CM | POA: Diagnosis not present

## 2018-06-09 ENCOUNTER — Encounter: Payer: Medicare Other | Admitting: Physical Therapy

## 2018-06-14 ENCOUNTER — Encounter: Payer: Medicare Other | Admitting: Physical Therapy

## 2018-06-16 ENCOUNTER — Ambulatory Visit: Payer: Medicare Other | Admitting: Physical Therapy

## 2018-06-20 ENCOUNTER — Encounter: Payer: Medicare Other | Admitting: Physical Therapy

## 2018-07-05 ENCOUNTER — Encounter: Payer: Medicare Other | Admitting: Physical Therapy

## 2018-07-07 ENCOUNTER — Encounter: Payer: Medicare Other | Admitting: Physical Therapy

## 2018-07-12 ENCOUNTER — Encounter: Payer: Medicare Other | Admitting: Physical Therapy

## 2018-07-12 DIAGNOSIS — R5382 Chronic fatigue, unspecified: Secondary | ICD-10-CM | POA: Diagnosis not present

## 2018-07-12 DIAGNOSIS — R768 Other specified abnormal immunological findings in serum: Secondary | ICD-10-CM | POA: Diagnosis not present

## 2018-07-12 DIAGNOSIS — Z6827 Body mass index (BMI) 27.0-27.9, adult: Secondary | ICD-10-CM | POA: Diagnosis not present

## 2018-07-12 DIAGNOSIS — M353 Polymyalgia rheumatica: Secondary | ICD-10-CM | POA: Diagnosis not present

## 2018-07-12 DIAGNOSIS — M2550 Pain in unspecified joint: Secondary | ICD-10-CM | POA: Diagnosis not present

## 2018-07-12 DIAGNOSIS — E663 Overweight: Secondary | ICD-10-CM | POA: Diagnosis not present

## 2018-07-14 ENCOUNTER — Encounter: Payer: Medicare Other | Admitting: Physical Therapy

## 2018-07-19 ENCOUNTER — Encounter: Payer: Medicare Other | Admitting: Physical Therapy

## 2018-07-21 ENCOUNTER — Encounter: Payer: Medicare Other | Admitting: Physical Therapy

## 2018-07-26 ENCOUNTER — Encounter: Payer: Medicare Other | Admitting: Physical Therapy

## 2018-07-28 ENCOUNTER — Encounter: Payer: Medicare Other | Admitting: Physical Therapy

## 2018-08-23 DIAGNOSIS — M2550 Pain in unspecified joint: Secondary | ICD-10-CM | POA: Diagnosis not present

## 2018-08-23 DIAGNOSIS — M353 Polymyalgia rheumatica: Secondary | ICD-10-CM | POA: Diagnosis not present

## 2018-08-23 DIAGNOSIS — E663 Overweight: Secondary | ICD-10-CM | POA: Diagnosis not present

## 2018-08-23 DIAGNOSIS — R5382 Chronic fatigue, unspecified: Secondary | ICD-10-CM | POA: Diagnosis not present

## 2018-08-23 DIAGNOSIS — Z6828 Body mass index (BMI) 28.0-28.9, adult: Secondary | ICD-10-CM | POA: Diagnosis not present

## 2018-08-23 DIAGNOSIS — R768 Other specified abnormal immunological findings in serum: Secondary | ICD-10-CM | POA: Diagnosis not present

## 2018-11-23 DIAGNOSIS — M353 Polymyalgia rheumatica: Secondary | ICD-10-CM | POA: Diagnosis not present

## 2018-11-23 DIAGNOSIS — R5382 Chronic fatigue, unspecified: Secondary | ICD-10-CM | POA: Diagnosis not present

## 2018-11-23 DIAGNOSIS — R768 Other specified abnormal immunological findings in serum: Secondary | ICD-10-CM | POA: Diagnosis not present

## 2018-11-23 DIAGNOSIS — M2550 Pain in unspecified joint: Secondary | ICD-10-CM | POA: Diagnosis not present

## 2018-11-30 DIAGNOSIS — R972 Elevated prostate specific antigen [PSA]: Secondary | ICD-10-CM | POA: Diagnosis not present

## 2018-11-30 DIAGNOSIS — R319 Hematuria, unspecified: Secondary | ICD-10-CM | POA: Diagnosis not present

## 2018-11-30 DIAGNOSIS — M353 Polymyalgia rheumatica: Secondary | ICD-10-CM | POA: Diagnosis not present

## 2019-01-02 DIAGNOSIS — R972 Elevated prostate specific antigen [PSA]: Secondary | ICD-10-CM | POA: Diagnosis not present

## 2019-01-02 DIAGNOSIS — R310 Gross hematuria: Secondary | ICD-10-CM | POA: Diagnosis not present

## 2019-01-17 DIAGNOSIS — R310 Gross hematuria: Secondary | ICD-10-CM | POA: Diagnosis not present

## 2019-01-17 DIAGNOSIS — N401 Enlarged prostate with lower urinary tract symptoms: Secondary | ICD-10-CM | POA: Diagnosis not present

## 2019-01-17 DIAGNOSIS — H35371 Puckering of macula, right eye: Secondary | ICD-10-CM | POA: Diagnosis not present

## 2019-01-17 DIAGNOSIS — H2513 Age-related nuclear cataract, bilateral: Secondary | ICD-10-CM | POA: Diagnosis not present

## 2019-01-17 DIAGNOSIS — H524 Presbyopia: Secondary | ICD-10-CM | POA: Diagnosis not present

## 2019-01-17 DIAGNOSIS — H35033 Hypertensive retinopathy, bilateral: Secondary | ICD-10-CM | POA: Diagnosis not present

## 2019-01-17 DIAGNOSIS — H25013 Cortical age-related cataract, bilateral: Secondary | ICD-10-CM | POA: Diagnosis not present

## 2019-01-26 DIAGNOSIS — R310 Gross hematuria: Secondary | ICD-10-CM | POA: Diagnosis not present

## 2019-01-26 DIAGNOSIS — R972 Elevated prostate specific antigen [PSA]: Secondary | ICD-10-CM | POA: Diagnosis not present

## 2019-02-22 DIAGNOSIS — M722 Plantar fascial fibromatosis: Secondary | ICD-10-CM | POA: Diagnosis not present

## 2019-02-23 DIAGNOSIS — M353 Polymyalgia rheumatica: Secondary | ICD-10-CM | POA: Diagnosis not present

## 2019-02-23 DIAGNOSIS — Z6827 Body mass index (BMI) 27.0-27.9, adult: Secondary | ICD-10-CM | POA: Diagnosis not present

## 2019-02-23 DIAGNOSIS — M2550 Pain in unspecified joint: Secondary | ICD-10-CM | POA: Diagnosis not present

## 2019-02-23 DIAGNOSIS — R768 Other specified abnormal immunological findings in serum: Secondary | ICD-10-CM | POA: Diagnosis not present

## 2019-02-23 DIAGNOSIS — E663 Overweight: Secondary | ICD-10-CM | POA: Diagnosis not present

## 2019-02-23 DIAGNOSIS — R5382 Chronic fatigue, unspecified: Secondary | ICD-10-CM | POA: Diagnosis not present

## 2019-03-03 ENCOUNTER — Other Ambulatory Visit: Payer: Self-pay

## 2019-03-03 ENCOUNTER — Other Ambulatory Visit: Payer: Self-pay | Admitting: Podiatry

## 2019-03-03 ENCOUNTER — Ambulatory Visit (INDEPENDENT_AMBULATORY_CARE_PROVIDER_SITE_OTHER): Payer: Medicare Other | Admitting: Podiatry

## 2019-03-03 ENCOUNTER — Ambulatory Visit (INDEPENDENT_AMBULATORY_CARE_PROVIDER_SITE_OTHER): Payer: Medicare Other

## 2019-03-03 DIAGNOSIS — M79672 Pain in left foot: Secondary | ICD-10-CM | POA: Diagnosis not present

## 2019-03-03 DIAGNOSIS — M722 Plantar fascial fibromatosis: Secondary | ICD-10-CM

## 2019-03-03 NOTE — Patient Instructions (Signed)

## 2019-03-09 NOTE — Progress Notes (Signed)
Subjective:   Patient ID: Devin Riggs, male   DOB: 73 y.o.   MRN: [id number]   HPI Patient presents stating he has had a lot of pain in the bottom of his left heel and it is been going on for a while and it seems to be worse with activity.  Patient states that he has tried different treatments so far without relief and patient does not smoke and likes to be active   Review of Systems  All other systems reviewed and are negative.       Objective:  Physical Exam Vitals signs and nursing note reviewed.  Constitutional:      Appearance: He is well-developed.  Pulmonary:     Effort: Pulmonary effort is normal.  Musculoskeletal: Normal range of motion.  Skin:    General: Skin is warm.  Neurological:     Mental Status: He is alert.     Neurovascular status intact muscle strength found to be adequate with range of motion within normal limits.  Patient is found to have mild equinus condition good digital perfusion well oriented x3 with exquisite discomfort plantar aspect left heel at the insertional point tendon calcaneus     Assessment:  Acute plantar fasciitis left with inflammation fluid at the insertion     Plan:  H&P and discussed all elements of condition with patient along with x-rays.  Today I did sterile prep and injected the left plantar fascia at insertion 3 mg Kenalog 5 mg Xylocaine and applied fascial brace to lift up the arch with instructions on support and stretching exercises.  Reappoint to reevaluate again in several weeks  X-rays indicate there is small spur formation no indications of stress fracture arthritis

## 2019-03-22 ENCOUNTER — Telehealth: Payer: Self-pay | Admitting: Orthotics

## 2019-03-22 NOTE — Telephone Encounter (Signed)
Left message that Devin Riggs will call when f/o come in.

## 2019-03-23 ENCOUNTER — Other Ambulatory Visit: Payer: Medicare Other | Admitting: Orthotics

## 2019-03-29 DIAGNOSIS — Z23 Encounter for immunization: Secondary | ICD-10-CM | POA: Diagnosis not present

## 2019-04-06 ENCOUNTER — Ambulatory Visit: Payer: Medicare Other | Admitting: Orthotics

## 2019-04-06 ENCOUNTER — Other Ambulatory Visit: Payer: Self-pay

## 2019-04-06 DIAGNOSIS — M722 Plantar fascial fibromatosis: Secondary | ICD-10-CM

## 2019-04-06 DIAGNOSIS — M79672 Pain in left foot: Secondary | ICD-10-CM

## 2019-04-06 NOTE — Progress Notes (Signed)
Patient came in today to pick up custom made foot orthotics.  The goals were accomplished and the patient reported no dissatisfaction with said orthotics.  Patient was advised of breakin period and how to report any issues. 

## 2019-04-28 DIAGNOSIS — R972 Elevated prostate specific antigen [PSA]: Secondary | ICD-10-CM | POA: Diagnosis not present

## 2019-05-01 DIAGNOSIS — R972 Elevated prostate specific antigen [PSA]: Secondary | ICD-10-CM | POA: Diagnosis not present

## 2019-05-01 DIAGNOSIS — R310 Gross hematuria: Secondary | ICD-10-CM | POA: Diagnosis not present

## 2019-05-17 DIAGNOSIS — H35033 Hypertensive retinopathy, bilateral: Secondary | ICD-10-CM | POA: Diagnosis not present

## 2019-05-17 DIAGNOSIS — F419 Anxiety disorder, unspecified: Secondary | ICD-10-CM | POA: Diagnosis not present

## 2019-05-17 DIAGNOSIS — Z1211 Encounter for screening for malignant neoplasm of colon: Secondary | ICD-10-CM | POA: Diagnosis not present

## 2019-05-17 DIAGNOSIS — E7800 Pure hypercholesterolemia, unspecified: Secondary | ICD-10-CM | POA: Diagnosis not present

## 2019-05-17 DIAGNOSIS — Z79899 Other long term (current) drug therapy: Secondary | ICD-10-CM | POA: Diagnosis not present

## 2019-05-17 DIAGNOSIS — I10 Essential (primary) hypertension: Secondary | ICD-10-CM | POA: Diagnosis not present

## 2019-05-17 DIAGNOSIS — Z0001 Encounter for general adult medical examination with abnormal findings: Secondary | ICD-10-CM | POA: Diagnosis not present

## 2019-06-01 DIAGNOSIS — R768 Other specified abnormal immunological findings in serum: Secondary | ICD-10-CM | POA: Diagnosis not present

## 2019-06-01 DIAGNOSIS — M2550 Pain in unspecified joint: Secondary | ICD-10-CM | POA: Diagnosis not present

## 2019-06-01 DIAGNOSIS — E663 Overweight: Secondary | ICD-10-CM | POA: Diagnosis not present

## 2019-06-01 DIAGNOSIS — R5382 Chronic fatigue, unspecified: Secondary | ICD-10-CM | POA: Diagnosis not present

## 2019-06-01 DIAGNOSIS — Z6827 Body mass index (BMI) 27.0-27.9, adult: Secondary | ICD-10-CM | POA: Diagnosis not present

## 2019-06-01 DIAGNOSIS — M353 Polymyalgia rheumatica: Secondary | ICD-10-CM | POA: Diagnosis not present

## 2019-07-31 ENCOUNTER — Ambulatory Visit: Payer: Medicare Other

## 2019-08-06 ENCOUNTER — Ambulatory Visit: Payer: Medicare Other

## 2019-08-30 DIAGNOSIS — R5382 Chronic fatigue, unspecified: Secondary | ICD-10-CM | POA: Diagnosis not present

## 2019-08-30 DIAGNOSIS — M2550 Pain in unspecified joint: Secondary | ICD-10-CM | POA: Diagnosis not present

## 2019-08-30 DIAGNOSIS — R768 Other specified abnormal immunological findings in serum: Secondary | ICD-10-CM | POA: Diagnosis not present

## 2019-08-30 DIAGNOSIS — E663 Overweight: Secondary | ICD-10-CM | POA: Diagnosis not present

## 2019-08-30 DIAGNOSIS — M353 Polymyalgia rheumatica: Secondary | ICD-10-CM | POA: Diagnosis not present

## 2019-08-30 DIAGNOSIS — Z6827 Body mass index (BMI) 27.0-27.9, adult: Secondary | ICD-10-CM | POA: Diagnosis not present

## 2020-01-18 DIAGNOSIS — M65311 Trigger thumb, right thumb: Secondary | ICD-10-CM | POA: Diagnosis not present

## 2020-01-18 DIAGNOSIS — M65312 Trigger thumb, left thumb: Secondary | ICD-10-CM | POA: Diagnosis not present

## 2020-01-19 DIAGNOSIS — H35371 Puckering of macula, right eye: Secondary | ICD-10-CM | POA: Diagnosis not present

## 2020-01-19 DIAGNOSIS — H43813 Vitreous degeneration, bilateral: Secondary | ICD-10-CM | POA: Diagnosis not present

## 2020-01-19 DIAGNOSIS — H25013 Cortical age-related cataract, bilateral: Secondary | ICD-10-CM | POA: Diagnosis not present

## 2020-01-19 DIAGNOSIS — H2513 Age-related nuclear cataract, bilateral: Secondary | ICD-10-CM | POA: Diagnosis not present

## 2020-03-28 DIAGNOSIS — Z23 Encounter for immunization: Secondary | ICD-10-CM | POA: Diagnosis not present

## 2020-04-05 DIAGNOSIS — Z23 Encounter for immunization: Secondary | ICD-10-CM | POA: Diagnosis not present

## 2020-05-21 DIAGNOSIS — Z79899 Other long term (current) drug therapy: Secondary | ICD-10-CM | POA: Diagnosis not present

## 2020-05-21 DIAGNOSIS — I10 Essential (primary) hypertension: Secondary | ICD-10-CM | POA: Diagnosis not present

## 2020-05-21 DIAGNOSIS — Z0001 Encounter for general adult medical examination with abnormal findings: Secondary | ICD-10-CM | POA: Diagnosis not present

## 2020-05-21 DIAGNOSIS — E7800 Pure hypercholesterolemia, unspecified: Secondary | ICD-10-CM | POA: Diagnosis not present

## 2020-05-21 DIAGNOSIS — R972 Elevated prostate specific antigen [PSA]: Secondary | ICD-10-CM | POA: Diagnosis not present

## 2020-06-06 DIAGNOSIS — Z0001 Encounter for general adult medical examination with abnormal findings: Secondary | ICD-10-CM | POA: Diagnosis not present

## 2020-07-04 DIAGNOSIS — R972 Elevated prostate specific antigen [PSA]: Secondary | ICD-10-CM | POA: Diagnosis not present

## 2020-07-04 DIAGNOSIS — R310 Gross hematuria: Secondary | ICD-10-CM | POA: Diagnosis not present

## 2020-09-28 ENCOUNTER — Encounter (HOSPITAL_COMMUNITY): Payer: Self-pay | Admitting: Emergency Medicine

## 2020-09-28 ENCOUNTER — Emergency Department (HOSPITAL_COMMUNITY): Payer: Medicare Other

## 2020-09-28 ENCOUNTER — Inpatient Hospital Stay (HOSPITAL_COMMUNITY)
Admission: EM | Admit: 2020-09-28 | Discharge: 2020-09-30 | DRG: 309 | Disposition: A | Discharge status: Home / Self Care | Payer: Medicare Other | Attending: Internal Medicine | Admitting: Internal Medicine

## 2020-09-28 ENCOUNTER — Other Ambulatory Visit: Payer: Self-pay

## 2020-09-28 DIAGNOSIS — Z882 Allergy status to sulfonamides status: Secondary | ICD-10-CM

## 2020-09-28 DIAGNOSIS — Z87891 Personal history of nicotine dependence: Secondary | ICD-10-CM | POA: Diagnosis not present

## 2020-09-28 DIAGNOSIS — R412 Retrograde amnesia: Secondary | ICD-10-CM | POA: Diagnosis present

## 2020-09-28 DIAGNOSIS — Z79899 Other long term (current) drug therapy: Secondary | ICD-10-CM

## 2020-09-28 DIAGNOSIS — G319 Degenerative disease of nervous system, unspecified: Secondary | ICD-10-CM | POA: Diagnosis not present

## 2020-09-28 DIAGNOSIS — Z7952 Long term (current) use of systemic steroids: Secondary | ICD-10-CM

## 2020-09-28 DIAGNOSIS — S60512A Abrasion of left hand, initial encounter: Secondary | ICD-10-CM | POA: Diagnosis present

## 2020-09-28 DIAGNOSIS — S2242XA Multiple fractures of ribs, left side, initial encounter for closed fracture: Secondary | ICD-10-CM | POA: Diagnosis not present

## 2020-09-28 DIAGNOSIS — R55 Syncope and collapse: Principal | ICD-10-CM

## 2020-09-28 DIAGNOSIS — Z28310 Unvaccinated for covid-19: Secondary | ICD-10-CM

## 2020-09-28 DIAGNOSIS — S0081XA Abrasion of other part of head, initial encounter: Secondary | ICD-10-CM | POA: Diagnosis present

## 2020-09-28 DIAGNOSIS — S80812A Abrasion, left lower leg, initial encounter: Secondary | ICD-10-CM | POA: Diagnosis present

## 2020-09-28 DIAGNOSIS — Z20822 Contact with and (suspected) exposure to covid-19: Secondary | ICD-10-CM | POA: Diagnosis not present

## 2020-09-28 DIAGNOSIS — K449 Diaphragmatic hernia without obstruction or gangrene: Secondary | ICD-10-CM | POA: Diagnosis not present

## 2020-09-28 DIAGNOSIS — I4891 Unspecified atrial fibrillation: Secondary | ICD-10-CM | POA: Diagnosis not present

## 2020-09-28 DIAGNOSIS — R053 Chronic cough: Secondary | ICD-10-CM | POA: Diagnosis present

## 2020-09-28 DIAGNOSIS — W19XXXA Unspecified fall, initial encounter: Secondary | ICD-10-CM | POA: Diagnosis present

## 2020-09-28 DIAGNOSIS — K8020 Calculus of gallbladder without cholecystitis without obstruction: Secondary | ICD-10-CM | POA: Diagnosis present

## 2020-09-28 DIAGNOSIS — S60511A Abrasion of right hand, initial encounter: Secondary | ICD-10-CM | POA: Diagnosis present

## 2020-09-28 DIAGNOSIS — E785 Hyperlipidemia, unspecified: Secondary | ICD-10-CM | POA: Diagnosis present

## 2020-09-28 DIAGNOSIS — R079 Chest pain, unspecified: Secondary | ICD-10-CM | POA: Diagnosis not present

## 2020-09-28 DIAGNOSIS — I4892 Unspecified atrial flutter: Principal | ICD-10-CM | POA: Diagnosis present

## 2020-09-28 DIAGNOSIS — Y92007 Garden or yard of unspecified non-institutional (private) residence as the place of occurrence of the external cause: Secondary | ICD-10-CM | POA: Diagnosis not present

## 2020-09-28 DIAGNOSIS — I10 Essential (primary) hypertension: Secondary | ICD-10-CM | POA: Diagnosis not present

## 2020-09-28 DIAGNOSIS — I248 Other forms of acute ischemic heart disease: Secondary | ICD-10-CM | POA: Diagnosis present

## 2020-09-28 DIAGNOSIS — J3489 Other specified disorders of nose and nasal sinuses: Secondary | ICD-10-CM | POA: Diagnosis not present

## 2020-09-28 DIAGNOSIS — M353 Polymyalgia rheumatica: Secondary | ICD-10-CM | POA: Diagnosis present

## 2020-09-28 DIAGNOSIS — F32A Depression, unspecified: Secondary | ICD-10-CM | POA: Diagnosis present

## 2020-09-28 DIAGNOSIS — S0990XA Unspecified injury of head, initial encounter: Secondary | ICD-10-CM | POA: Diagnosis not present

## 2020-09-28 HISTORY — DX: Hyperlipidemia, unspecified: E78.5

## 2020-09-28 HISTORY — DX: Depression, unspecified: F32.A

## 2020-09-28 HISTORY — DX: Essential (primary) hypertension: I10

## 2020-09-28 HISTORY — DX: Polymyalgia rheumatica: M35.3

## 2020-09-28 LAB — URINALYSIS, ROUTINE W REFLEX MICROSCOPIC
Bilirubin Urine: NEGATIVE
Glucose, UA: NEGATIVE mg/dL
Ketones, ur: NEGATIVE mg/dL
Leukocytes,Ua: NEGATIVE
Nitrite: NEGATIVE
Protein, ur: NEGATIVE mg/dL
Specific Gravity, Urine: 1.006 (ref 1.005–1.030)
pH: 5 (ref 5.0–8.0)

## 2020-09-28 LAB — BASIC METABOLIC PANEL
Anion gap: 9 (ref 5–15)
BUN: 25 mg/dL — ABNORMAL HIGH (ref 8–23)
CO2: 22 mmol/L (ref 22–32)
Calcium: 8.9 mg/dL (ref 8.9–10.3)
Chloride: 107 mmol/L (ref 98–111)
Creatinine, Ser: 1.1 mg/dL (ref 0.61–1.24)
GFR, Estimated: >60 mL/min (ref >60)
Glucose, Bld: 99 mg/dL (ref 70–99)
Potassium: 3.7 mmol/L (ref 3.5–5.1)
Sodium: 138 mmol/L (ref 135–145)

## 2020-09-28 LAB — CBC
HCT: 46.3 % (ref 39.0–52.0)
Hemoglobin: 15.9 g/dL (ref 13.0–17.0)
MCH: 32 pg (ref 26.0–34.0)
MCHC: 34.3 g/dL (ref 30.0–36.0)
MCV: 93.2 fL (ref 80.0–100.0)
Platelets: 214 10*3/uL (ref 150–400)
RBC: 4.97 MIL/uL (ref 4.22–5.81)
RDW: 12.5 % (ref 11.5–15.5)
WBC: 6.8 10*3/uL (ref 4.0–10.5)
nRBC: 0 % (ref 0.0–0.2)

## 2020-09-28 LAB — CBG MONITORING, ED: Glucose-Capillary: 91 mg/dL (ref 70–99)

## 2020-09-28 LAB — MAGNESIUM: Magnesium: 2.2 mg/dL (ref 1.7–2.4)

## 2020-09-28 LAB — TROPONIN I (HIGH SENSITIVITY): Troponin I (High Sensitivity): 27 ng/L — ABNORMAL HIGH (ref <18)

## 2020-09-28 MED ORDER — ACETAMINOPHEN 650 MG RE SUPP: 650.0000 mg | Freq: Four times a day (QID) | RECTAL | Status: DC | PRN

## 2020-09-28 MED ORDER — ACETAMINOPHEN 325 MG PO TABS
650.0000 mg | ORAL_TABLET | Freq: Four times a day (QID) | ORAL | Status: DC | PRN
Start: 2020-09-28 — End: 2020-09-30

## 2020-09-28 MED ORDER — DILTIAZEM HCL 25 MG/5ML IV SOLN
20.0000 mg | Freq: Once | INTRAVENOUS | Status: AC
  Administered 2020-09-28: 20 mg via INTRAVENOUS
  Filled 2020-09-28: qty 5

## 2020-09-28 NOTE — ED Triage Notes (Signed)
Pt reports loss of consciousness. Unsure of how long he was down. Denies pain. Some abrasions noted to the L side of his face. Denies cardiac hx.

## 2020-09-28 NOTE — H&P (Addendum)
History and Physical    Devin Riggs QJJ:[id number] DOB: July 15, 1945 DOA: 09/28/2020  PCP: Darrow Bussing, MD Patient coming from: Home  Chief Complaint: Syncope  HPI: Devin Riggs is a 75 y.o. male with a past medical history of hypertension, hyperlipidemia, depression, polymyalgia rheumatica presented to the ED after a syncopal episode.  In the ED, patient noted to have superficial abrasion over his scalp.  Found to be in new onset atrial flutter and rate initially in the 120s to 130s.  Labs showing WBC 6.8, hemoglobin 15.9, platelet count 214K.sodium 138, potassium 3.7, chloride 107, bicarb 22, BUN 25, creatinine 1.1, glucose 99.  UA not suggestive of infection.  High-sensitivity troponin pending.  Magnesium level pending.  Screening Covid test pending.  Head CT negative for acute finding.  Chest x-ray negative for acute finding.  Patient was given IV diltiazem 20 mg and rate improved.  History provided by patient and his wife at bedside.  Wife states patient was with family cooking dinner.  She asked him to go turn on the grill in the backyard.  His granddaughter then saw him return to the house but they became concerned that he had fallen as he had scratches on his head and hands but did not remember what happened.  Patient also confirms that he does not remember what happened.  States he was in his usual state of health and feeling great.  Denies any lightheadedness/dizziness, palpitations, shortness of breath, or chest pain.  He is very physically active, swims and exercises on a regular basis.  He does drink a few bourbons every night and did have drinks tonight but this was not unusual.  He takes medications for hypertension (lisinopril), hyperlipidemia (Lipitor), and depression (sertraline).  States his rheumatologist prescribed prednisone for polymyalgia rheumatica and he finished a course a year ago and has not had any issues since then.  Denies any other medical problems.  Denies  history of seizures.  Denies history of arrhythmia or heart problems.  He reports history of chronic intermittent dry cough, no recent change.  No other complaints.  Review of Systems:  All systems reviewed and apart from history of presenting illness, are negative.  History reviewed. No pertinent past medical history.  History reviewed. No pertinent surgical history.   has no history on file for tobacco use, alcohol use, and drug use.  Allergies  Allergen Reactions  . Sulfa Antibiotics Nausea And Vomiting    History reviewed. No pertinent family history.  Prior to Admission medications   Medication Sig Start Date End Date Taking? Authorizing Provider  atorvastatin (LIPITOR) 10 MG tablet  12/07/18   [provider]  DENTA 5000 PLUS 1.1 % CREA dental cream  01/02/19   [provider]  ibuprofen (ADVIL,MOTRIN) 200 MG tablet Take 200 mg by mouth every 4 (four) hours as needed.    [provider]  lisinopril (ZESTRIL) 20 MG tablet  12/07/18   [provider]  predniSONE (DELTASONE) 1 MG tablet  02/23/19   [provider]  sertraline (ZOLOFT) 100 MG tablet  12/11/18   [provider]    Physical Exam: Vitals:   09/28/20 2130 09/28/20 2201 09/28/20 2230 09/28/20 2300  BP: 115/81 (!) 131/91 (!) 127/96 118/83  Pulse: 88 78 (!) 106 78  Resp: 19 15 15 20   Temp:      TempSrc:      SpO2: 93% 94% 95% 99%  Weight:      Height:  Physical Exam Constitutional:      General: He is not in acute distress. HENT:     Head: Normocephalic.     Comments: Superficial skin abrasion on forehead Eyes:     Extraocular Movements: Extraocular movements intact.     Conjunctiva/sclera: Conjunctivae normal.  Cardiovascular:     Rate and Rhythm: Normal rate and regular rhythm.     Pulses: Normal pulses.  Pulmonary:     Effort: Pulmonary effort is normal. No respiratory distress.     Breath sounds: Normal breath sounds. No wheezing or rales.   Abdominal:     General: Bowel sounds are normal. There is no distension.     Palpations: Abdomen is soft.     Tenderness: There is no abdominal tenderness.  Musculoskeletal:        General: No swelling or tenderness.     Cervical back: Normal range of motion and neck supple.  Skin:    General: Skin is warm and dry.  Neurological:     General: No focal deficit present.     Mental Status: He is alert and oriented to person, place, and time.     Cranial Nerves: No cranial nerve deficit.     Sensory: No sensory deficit.     Motor: No weakness.     Labs on Admission: I have personally reviewed following labs and imaging studies  CBC: Recent Labs  Lab 09/28/20 2055  WBC 6.8  HGB 15.9  HCT 46.3  MCV 93.2  PLT 214   Basic Metabolic Panel: Recent Labs  Lab 09/28/20 2055  NA 138  K 3.7  CL 107  CO2 22  GLUCOSE 99  BUN 25*  CREATININE 1.10  CALCIUM 8.9   GFR: Estimated Creatinine Clearance: 56.1 mL/min (by C-G formula based on SCr of 1.1 mg/dL). Liver Function Tests: No results for input(s): AST, ALT, ALKPHOS, BILITOT, PROT, ALBUMIN in the last 168 hours. No results for input(s): LIPASE, AMYLASE in the last 168 hours. No results for input(s): AMMONIA in the last 168 hours. Coagulation Profile: No results for input(s): INR, PROTIME in the last 168 hours. Cardiac Enzymes: No results for input(s): CKTOTAL, CKMB, CKMBINDEX, TROPONINI in the last 168 hours. BNP (last 3 results) No results for input(s): PROBNP in the last 8760 hours. HbA1C: No results for input(s): HGBA1C in the last 72 hours. CBG: Recent Labs  Lab 09/28/20 2104  GLUCAP 91   Lipid Profile: No results for input(s): CHOL, HDL, LDLCALC, TRIG, CHOLHDL, LDLDIRECT in the last 72 hours. Thyroid Function Tests: No results for input(s): TSH, T4TOTAL, FREET4, T3FREE, THYROIDAB in the last 72 hours. Anemia Panel: No results for input(s): VITAMINB12, FOLATE, FERRITIN, TIBC, IRON, RETICCTPCT in the last 72  hours. Urine analysis:    Component Value Date/Time   COLORURINE STRAW (A) 09/28/2020 2100   APPEARANCEUR CLEAR 09/28/2020 2100   LABSPEC 1.006 09/28/2020 2100   PHURINE 5.0 09/28/2020 2100   GLUCOSEU NEGATIVE 09/28/2020 2100   HGBUR MODERATE (A) 09/28/2020 2100   BILIRUBINUR NEGATIVE 09/28/2020 2100   KETONESUR NEGATIVE 09/28/2020 2100   PROTEINUR NEGATIVE 09/28/2020 2100   NITRITE NEGATIVE 09/28/2020 2100   LEUKOCYTESUR NEGATIVE 09/28/2020 2100    Radiological Exams on Admission: DG Chest 2 View  Result Date: 09/28/2020 CLINICAL DATA:  Syncope with loss of consciousness. Lower left chest pain. Former smoker. EXAM: CHEST - 2 VIEW COMPARISON:  None. FINDINGS: The heart size and mediastinal contours are within normal limits. Both lungs are clear. The visualized skeletal structures  are unremarkable. IMPRESSION: No active cardiopulmonary disease. Electronically Signed   By: Burman NievesWilliam  Stevens M.D.   On: 09/28/2020 22:19   CT Head Wo Contrast  Result Date: 09/28/2020 CLINICAL DATA:  75 year old male with head trauma. EXAM: CT HEAD WITHOUT CONTRAST TECHNIQUE: Contiguous axial images were obtained from the base of the skull through the vertex without intravenous contrast. COMPARISON:  None. FINDINGS: Brain: Mild age-related atrophy and chronic microvascular ischemic changes. There is no acute intracranial hemorrhage. No mass effect or midline shift. No extra-axial fluid collection. Vascular: No hyperdense vessel or unexpected calcification. Skull: Normal. Negative for fracture or focal lesion. Sinuses/Orbits: There is mild mucoperiosteal thickening of paranasal sinuses. No air-fluid level. The mastoid air cells are clear. Other: None IMPRESSION: 1. No acute intracranial pathology. 2. Mild age-related atrophy and chronic microvascular ischemic changes. Electronically Signed   By: Elgie CollardArash  Radparvar M.D.   On: 09/28/2020 22:41    EKG: Independently reviewed.  Atrial flutter.  Anteroseptal infarct, age  indeterminate.  No prior tracing for comparison.  Assessment/Plan Principal Problem:   New onset atrial flutter (HCC) Active Problems:   Syncope   HTN (hypertension)   HLD (hyperlipidemia)   Depression   New onset atrial flutter EKG showing atrial flutter which appears to be new as he has no prior history of arrhythmia.  Electrolytes including potassium and magnesium within normal range.  PE less likely to be a precipitating factor given no hypoxia.  Patient was given IV diltiazem 20 mg in the ED.  Rhythm is still atrial flutter but rate controlled now and not symptomatic.  CHA2DS2-VASc 3 based on age and history of hypertension. Discussed with on-call cardiologist Dr. Liane Comberarnicelli who recommends starting anticoagulation as he may need cardioversion if he does not convert to sinus rhythm in the next 24 hours.  He agrees with starting low-dose p.o. Cardizem 30 mg every 6 hours for rate control. -Cardiac monitoring.  Start IV heparin for anticoagulation (discussed with the patient).  Start p.o. Cardizem. Echocardiogram ordered. Check TSH level.  Check D-dimer level.  Continue to monitor electrolytes.  Syncope Syncope without prodrome could be due to arrhythmia/new onset atrial flutter but other etiologies need to be considered.  Patient does report drinking a few bourbons tonight which he seems to be doing on a regular basis but could also possibly be contributing. -Cardiac monitoring.  Management of new onset atrial flutter as mentioned above.  Echocardiogram ordered.  Order EEG and carotid Dopplers.  Check orthostatics.  Addendum: D-dimer was also ordered and came back elevated, stat CTA ordered to rule out PE.  Mild troponinemia: Likely due to demand ischemia from new onset atrial flutter.  High-sensitivity troponin mildly elevated at 27.  ACS less likely as patient is not endorsing any chest pain. -Cardiac monitoring, continue to trend troponin  Alcohol use Reports drinking a few bourbons  every night.  No signs of withdrawal at this time. -CIWA protocol; Ativan as needed.,  Thiamine, folate, and multivitamin.  Hypertension Blood pressure stable. -Started on Cardizem for atrial flutter.  Stop home lisinopril given complaint of chronic cough.  Chronic cough Patient reports history of chronic intermittent dry cough.  Takes lisinopril which could be contributing.  Chest x-ray not suggestive of pneumonia. -Stop ACE inhibitor  Hyperlipidemia -Resume home Lipitor after pharmacy med rec is done.  Depression -Resume home sertraline after pharmacy med rec is done  DVT prophylaxis: SCDs at this time Code Status: Patient wishes to be full code. Family Communication: Wife at bedside. Disposition Plan:  Status is: Observation  The patient remains OBS appropriate and will d/c before 2 midnights.  Dispo: The patient is from: Home              Anticipated d/c is to: Home              Patient currently is not medically stable to d/c.   Difficult to place patient No  Level of care: Level of care: Telemetry   The medical decision making on this patient was of high complexity and the patient is at high risk for clinical deterioration, therefore this is a level 3 visit.  John Giovanni MD [facility]  If 7PM-7AM, please contact night-coverage [email or web address]  09/28/2020, 11:37 PM

## 2020-09-28 NOTE — ED Provider Notes (Signed)
Devin Riggs Arrival date & time: 09/28/20  1957     History Chief Complaint  Patient presents with  . Loss of Consciousness    Devin Riggs is a 75 y.o. male.  Presented to ER with concern for syncope.  Patient reports that he is in his normal state of health today, not had any symptoms.  Family member noted that he had a bruise on the left side of his head.  He does not recall how he fell, does not recall having any bladder or bowel incontinence, unsure when exactly but had to open sometime this evening.  Currently denies any symptoms.  States he is a very healthy active person.  Exercises regularly.  Normally his heart rate runs low.  Denies prior history of atrial fibrillation.  History of hypertension, hyperlipidemia.  HPI     History reviewed. No pertinent past medical history.  There are no problems to display for this patient.   History reviewed. No pertinent surgical history.     History reviewed. No pertinent family history.     Home Medications Prior to Admission medications   Medication Sig Start Date End Date Taking? Authorizing Provider  atorvastatin (LIPITOR) 10 MG tablet  12/07/18   [provider]  DENTA 5000 PLUS 1.1 % CREA dental cream  01/02/19   [provider]  ibuprofen (ADVIL,MOTRIN) 200 MG tablet Take 200 mg by mouth every 4 (four) hours as needed.    [provider]  lisinopril (ZESTRIL) 20 MG tablet  12/07/18   [provider]  predniSONE (DELTASONE) 1 MG tablet  02/23/19   [provider]  sertraline (ZOLOFT) 100 MG tablet  12/11/18   [provider]    Allergies    Sulfa antibiotics  Review of Systems   Review of Systems  Constitutional: Negative for chills and fever.  HENT: Negative for ear pain and sore throat.   Eyes: Negative for pain and visual disturbance.  Respiratory: Negative for cough and shortness of  breath.   Cardiovascular: Negative for chest pain and palpitations.  Gastrointestinal: Negative for abdominal pain and vomiting.  Genitourinary: Negative for dysuria and hematuria.  Musculoskeletal: Negative for arthralgias and back pain.  Skin: Negative for color change and rash.  Neurological: Negative for seizures and syncope.  All other systems reviewed and are negative.   Physical Exam Updated Vital Signs BP (!) 127/96   Pulse (!) 106   Temp 98 F (36.7 C) (Oral)   Resp 15   Ht 5\' 8"  (1.727 m)   Wt 81.6 kg   SpO2 95%   BMI 27.37 kg/m   Physical Exam Vitals and nursing note reviewed.  Constitutional:      Appearance: He is well-developed.  HENT:     Head: Normocephalic and atraumatic.  Eyes:     Conjunctiva/sclera: Conjunctivae normal.  Cardiovascular:     Rate and Rhythm: Tachycardia present. Rhythm irregular.     Heart sounds: No murmur heard.   Pulmonary:     Effort: Pulmonary effort is normal. No respiratory distress.     Breath sounds: Normal breath sounds.  Abdominal:     Palpations: Abdomen is soft.     Tenderness: There is no abdominal tenderness.  Musculoskeletal:        General: No deformity or signs of injury.     Cervical back: Neck supple.  Skin:    General: Skin is warm and dry.  Neurological:  General: No focal deficit present.     Mental Status: He is alert.  Psychiatric:        Mood and Affect: Mood normal.        Behavior: Behavior normal.     ED Results / Procedures / Treatments   Labs (all labs ordered are listed, but only abnormal results are displayed) Labs Reviewed  BASIC METABOLIC PANEL - Abnormal; Notable for the following components:      Result Value   BUN 25 (*)    All other components within normal limits  URINALYSIS, ROUTINE W REFLEX MICROSCOPIC - Abnormal; Notable for the following components:   Color, Urine STRAW (*)    Hgb urine dipstick MODERATE (*)    Bacteria, UA RARE (*)    All other components within  normal limits  SARS CORONAVIRUS 2 (TAT 6-24 HRS)  CBC  MAGNESIUM  CBG MONITORING, ED  TROPONIN I (HIGH SENSITIVITY)    EKG EKG Interpretation  Date/Time:  Saturday September 28 2020 20:07:04 EDT Ventricular Rate:  108 PR Interval:    QRS Duration: 122 QT Interval:  365 QTC Calculation: 481 R Axis:   -14 Text Interpretation: Atrial flutter with varied AV block, Multiple ventricular premature complexes IVCD, consider atypical RBBB Anteroseptal infarct, age indeterminate Minimal ST elevation, inferior leads 12 Lead; Mason-Likar Confirmed by Marianna Fuss ([id number]) on 09/28/2020 9:41:29 PM   Radiology DG Chest 2 View  Result Date: 09/28/2020 CLINICAL DATA:  Syncope with loss of consciousness. Lower left chest pain. Former smoker. EXAM: CHEST - 2 VIEW COMPARISON:  None. FINDINGS: The heart size and mediastinal contours are within normal limits. Both lungs are clear. The visualized skeletal structures are unremarkable. IMPRESSION: No active cardiopulmonary disease. Electronically Signed   By: Burman Nieves M.D.   On: 09/28/2020 22:19   CT Head Wo Contrast  Result Date: 09/28/2020 CLINICAL DATA:  75 year old male with head trauma. EXAM: CT HEAD WITHOUT CONTRAST TECHNIQUE: Contiguous axial images were obtained from the base of the skull through the vertex without intravenous contrast. COMPARISON:  None. FINDINGS: Brain: Mild age-related atrophy and chronic microvascular ischemic changes. There is no acute intracranial hemorrhage. No mass effect or midline shift. No extra-axial fluid collection. Vascular: No hyperdense vessel or unexpected calcification. Skull: Normal. Negative for fracture or focal lesion. Sinuses/Orbits: There is mild mucoperiosteal thickening of paranasal sinuses. No air-fluid level. The mastoid air cells are clear. Other: None IMPRESSION: 1. No acute intracranial pathology. 2. Mild age-related atrophy and chronic microvascular ischemic changes. Electronically Signed   By: Elgie Collard M.D.   On: 09/28/2020 22:41    Procedures Procedures   Medications Ordered in ED Medications  diltiazem (CARDIZEM) injection 20 mg (20 mg Intravenous Given 09/28/20 2256)    ED Course  I have reviewed the triage vital signs and the nursing notes.  Pertinent labs & imaging results that were available during my care of the patient were reviewed by me and considered in my medical decision making (see chart for details).    MDM Rules/Calculators/A&P                          75 year old male presenting to ER after syncopal episode.  On exam he was noted to be well-appearing, noted superficial abrasion over his scalp.  EKG was noted for atrial flutter versus atrial fibrillation.  This appears to be new onset.  No prior history.  Patient does not appear symptomatic at present from this.  CT head negative.  CXR negative basic labs stable.  Given new onset A. fib/flutter and syncope, believe patient would benefit from admission for further observation.  Will consult hospitalist for further management.  Provided single dose of IV Dilts and currently rate controlled.  Final Clinical Impression(s) / ED Diagnoses Final diagnoses:  Syncope, unspecified syncope type  Atrial fibrillation, unspecified type [facility])    Rx / DC Orders ED Discharge Orders    None       Milagros Loll, MD 09/28/20 2303

## 2020-09-29 ENCOUNTER — Observation Stay (HOSPITAL_BASED_OUTPATIENT_CLINIC_OR_DEPARTMENT_OTHER): Payer: Medicare Other

## 2020-09-29 ENCOUNTER — Inpatient Hospital Stay (HOSPITAL_COMMUNITY): Payer: Medicare Other

## 2020-09-29 ENCOUNTER — Observation Stay (HOSPITAL_COMMUNITY): Payer: Medicare Other

## 2020-09-29 ENCOUNTER — Encounter (HOSPITAL_COMMUNITY): Payer: Self-pay | Admitting: Internal Medicine

## 2020-09-29 DIAGNOSIS — Z882 Allergy status to sulfonamides status: Secondary | ICD-10-CM | POA: Diagnosis not present

## 2020-09-29 DIAGNOSIS — Z79899 Other long term (current) drug therapy: Secondary | ICD-10-CM | POA: Diagnosis not present

## 2020-09-29 DIAGNOSIS — M353 Polymyalgia rheumatica: Secondary | ICD-10-CM | POA: Diagnosis present

## 2020-09-29 DIAGNOSIS — R55 Syncope and collapse: Secondary | ICD-10-CM | POA: Diagnosis not present

## 2020-09-29 DIAGNOSIS — Y92007 Garden or yard of unspecified non-institutional (private) residence as the place of occurrence of the external cause: Secondary | ICD-10-CM | POA: Diagnosis not present

## 2020-09-29 DIAGNOSIS — Z7952 Long term (current) use of systemic steroids: Secondary | ICD-10-CM | POA: Diagnosis not present

## 2020-09-29 DIAGNOSIS — R053 Chronic cough: Secondary | ICD-10-CM | POA: Diagnosis present

## 2020-09-29 DIAGNOSIS — S80812A Abrasion, left lower leg, initial encounter: Secondary | ICD-10-CM | POA: Diagnosis present

## 2020-09-29 DIAGNOSIS — G319 Degenerative disease of nervous system, unspecified: Secondary | ICD-10-CM | POA: Diagnosis not present

## 2020-09-29 DIAGNOSIS — W19XXXA Unspecified fall, initial encounter: Secondary | ICD-10-CM | POA: Diagnosis present

## 2020-09-29 DIAGNOSIS — E785 Hyperlipidemia, unspecified: Secondary | ICD-10-CM | POA: Diagnosis present

## 2020-09-29 DIAGNOSIS — I4892 Unspecified atrial flutter: Secondary | ICD-10-CM | POA: Diagnosis present

## 2020-09-29 DIAGNOSIS — S60512A Abrasion of left hand, initial encounter: Secondary | ICD-10-CM | POA: Diagnosis present

## 2020-09-29 DIAGNOSIS — Z20822 Contact with and (suspected) exposure to covid-19: Secondary | ICD-10-CM | POA: Diagnosis present

## 2020-09-29 DIAGNOSIS — R412 Retrograde amnesia: Secondary | ICD-10-CM | POA: Diagnosis present

## 2020-09-29 DIAGNOSIS — K8020 Calculus of gallbladder without cholecystitis without obstruction: Secondary | ICD-10-CM | POA: Diagnosis present

## 2020-09-29 DIAGNOSIS — I10 Essential (primary) hypertension: Secondary | ICD-10-CM | POA: Diagnosis present

## 2020-09-29 DIAGNOSIS — S0081XA Abrasion of other part of head, initial encounter: Secondary | ICD-10-CM | POA: Diagnosis present

## 2020-09-29 DIAGNOSIS — I248 Other forms of acute ischemic heart disease: Secondary | ICD-10-CM | POA: Diagnosis present

## 2020-09-29 DIAGNOSIS — K449 Diaphragmatic hernia without obstruction or gangrene: Secondary | ICD-10-CM | POA: Diagnosis not present

## 2020-09-29 DIAGNOSIS — S2242XA Multiple fractures of ribs, left side, initial encounter for closed fracture: Secondary | ICD-10-CM | POA: Diagnosis present

## 2020-09-29 DIAGNOSIS — S60511A Abrasion of right hand, initial encounter: Secondary | ICD-10-CM | POA: Diagnosis present

## 2020-09-29 DIAGNOSIS — Z87891 Personal history of nicotine dependence: Secondary | ICD-10-CM | POA: Diagnosis not present

## 2020-09-29 DIAGNOSIS — R079 Chest pain, unspecified: Secondary | ICD-10-CM | POA: Diagnosis not present

## 2020-09-29 DIAGNOSIS — I4891 Unspecified atrial fibrillation: Secondary | ICD-10-CM | POA: Diagnosis present

## 2020-09-29 DIAGNOSIS — Z28310 Unvaccinated for covid-19: Secondary | ICD-10-CM | POA: Diagnosis not present

## 2020-09-29 DIAGNOSIS — F32A Depression, unspecified: Secondary | ICD-10-CM | POA: Diagnosis present

## 2020-09-29 LAB — COMPREHENSIVE METABOLIC PANEL
ALT: 26 U/L (ref 0–44)
AST: 25 U/L (ref 15–41)
Albumin: 3.8 g/dL (ref 3.5–5.0)
Alkaline Phosphatase: 88 U/L (ref 38–126)
Anion gap: 5 (ref 5–15)
BUN: 19 mg/dL (ref 8–23)
CO2: 26 mmol/L (ref 22–32)
Calcium: 8.9 mg/dL (ref 8.9–10.3)
Chloride: 106 mmol/L (ref 98–111)
Creatinine, Ser: 1.07 mg/dL (ref 0.61–1.24)
GFR, Estimated: >60 mL/min (ref >60)
Glucose, Bld: 101 mg/dL — ABNORMAL HIGH (ref 70–99)
Potassium: 5 mmol/L (ref 3.5–5.1)
Sodium: 137 mmol/L (ref 135–145)
Total Bilirubin: 0.5 mg/dL (ref 0.3–1.2)
Total Protein: 6.5 g/dL (ref 6.5–8.1)

## 2020-09-29 LAB — POTASSIUM: Potassium: 3.5 mmol/L (ref 3.5–5.1)

## 2020-09-29 LAB — CBC
HCT: 50 % (ref 39.0–52.0)
Hemoglobin: 16.9 g/dL (ref 13.0–17.0)
MCH: 31.8 pg (ref 26.0–34.0)
MCHC: 33.8 g/dL (ref 30.0–36.0)
MCV: 94 fL (ref 80.0–100.0)
Platelets: 239 10*3/uL (ref 150–400)
RBC: 5.32 MIL/uL (ref 4.22–5.81)
RDW: 12.6 % (ref 11.5–15.5)
WBC: 8.5 10*3/uL (ref 4.0–10.5)
nRBC: 0 % (ref 0.0–0.2)

## 2020-09-29 LAB — ECHOCARDIOGRAM COMPLETE
Area-P 1/2: 4.15 cm2
Height: 65.5 in
S' Lateral: 3 cm
Weight: 2836 oz

## 2020-09-29 LAB — TSH: TSH: 1.87 u[IU]/mL (ref 0.350–4.500)

## 2020-09-29 LAB — HEPARIN LEVEL (UNFRACTIONATED): Heparin Unfractionated: 0.35 IU/mL (ref 0.30–0.70)

## 2020-09-29 LAB — SARS CORONAVIRUS 2 (TAT 6-24 HRS): SARS Coronavirus 2: NEGATIVE

## 2020-09-29 LAB — TROPONIN I (HIGH SENSITIVITY): Troponin I (High Sensitivity): 25 ng/L — ABNORMAL HIGH (ref <18)

## 2020-09-29 LAB — D-DIMER, QUANTITATIVE: D-Dimer, Quant: 1.73 ug/mL-FEU — ABNORMAL HIGH (ref 0.00–0.50)

## 2020-09-29 LAB — MAGNESIUM: Magnesium: 2.2 mg/dL (ref 1.7–2.4)

## 2020-09-29 LAB — VITAMIN B12: Vitamin B-12: 378 pg/mL (ref 180–914)

## 2020-09-29 MED ORDER — LORAZEPAM 2 MG/ML IJ SOLN: 1.0000 mg | INTRAMUSCULAR | Status: DC | PRN

## 2020-09-29 MED ORDER — ATORVASTATIN CALCIUM 10 MG PO TABS
10.0000 mg | ORAL_TABLET | Freq: Every day | ORAL | Status: DC
  Administered 2020-09-29 – 2020-09-30 (×2): 10 mg via ORAL
  Filled 2020-09-29 (×2): qty 1

## 2020-09-29 MED ORDER — THIAMINE HCL 100 MG PO TABS
100.0000 mg | ORAL_TABLET | Freq: Every day | ORAL | Status: DC
  Administered 2020-09-29: 100 mg via ORAL
  Filled 2020-09-29: qty 1

## 2020-09-29 MED ORDER — FOLIC ACID 1 MG PO TABS
1.0000 mg | ORAL_TABLET | Freq: Every day | ORAL | Status: DC
  Administered 2020-09-29 – 2020-09-30 (×2): 1 mg via ORAL
  Filled 2020-09-29 (×2): qty 1

## 2020-09-29 MED ORDER — APIXABAN 5 MG PO TABS
5.0000 mg | ORAL_TABLET | Freq: Two times a day (BID) | ORAL | Status: DC
  Administered 2020-09-29 – 2020-09-30 (×3): 5 mg via ORAL
  Filled 2020-09-29 (×3): qty 1

## 2020-09-29 MED ORDER — THIAMINE HCL 100 MG/ML IJ SOLN: 100.0000 mg | Freq: Every day | INTRAMUSCULAR | Status: DC

## 2020-09-29 MED ORDER — DILTIAZEM HCL 30 MG PO TABS
30.0000 mg | ORAL_TABLET | Freq: Four times a day (QID) | ORAL | Status: DC
  Administered 2020-09-29 (×2): 30 mg via ORAL
  Filled 2020-09-29 (×2): qty 1

## 2020-09-29 MED ORDER — CYANOCOBALAMIN 500 MCG PO TABS
250.0000 ug | ORAL_TABLET | Freq: Every day | ORAL | Status: DC
  Administered 2020-09-29 – 2020-09-30 (×2): 250 ug via ORAL
  Filled 2020-09-29 (×2): qty 1

## 2020-09-29 MED ORDER — HEPARIN (PORCINE) 25000 UT/250ML-% IV SOLN
1000.0000 [IU]/h | INTRAVENOUS | Status: DC
  Administered 2020-09-29: 1000 [IU]/h via INTRAVENOUS
  Filled 2020-09-29: qty 250

## 2020-09-29 MED ORDER — THIAMINE HCL 100 MG/ML IJ SOLN: 250.0000 mg | Freq: Every day | INTRAVENOUS | Status: DC

## 2020-09-29 MED ORDER — THIAMINE HCL 100 MG/ML IJ SOLN
500.0000 mg | Freq: Three times a day (TID) | INTRAVENOUS | Status: DC
  Administered 2020-09-29 – 2020-09-30 (×3): 500 mg via INTRAVENOUS
  Filled 2020-09-29 (×5): qty 5

## 2020-09-29 MED ORDER — IOHEXOL 350 MG/ML SOLN
100.0000 mL | Freq: Once | INTRAVENOUS | Status: AC | PRN
  Administered 2020-09-29: 100 mL via INTRAVENOUS

## 2020-09-29 MED ORDER — POTASSIUM CHLORIDE CRYS ER 20 MEQ PO TBCR
40.0000 meq | EXTENDED_RELEASE_TABLET | Freq: Once | ORAL | Status: AC
  Administered 2020-09-29: 40 meq via ORAL
  Filled 2020-09-29: qty 2

## 2020-09-29 MED ORDER — HEPARIN BOLUS VIA INFUSION
4000.0000 [IU] | Freq: Once | INTRAVENOUS | Status: AC
  Administered 2020-09-29: 4000 [IU] via INTRAVENOUS
  Filled 2020-09-29: qty 4000

## 2020-09-29 MED ORDER — LORAZEPAM 1 MG PO TABS: 1.0000 mg | ORAL_TABLET | ORAL | Status: DC | PRN

## 2020-09-29 MED ORDER — ADULT MULTIVITAMIN W/MINERALS CH
1.0000 | ORAL_TABLET | Freq: Every day | ORAL | Status: DC
  Administered 2020-09-29 – 2020-09-30 (×2): 1 via ORAL
  Filled 2020-09-29 (×4): qty 1

## 2020-09-29 NOTE — ED Notes (Signed)
Report called to Llana Aliment, RN on 4th floor

## 2020-09-29 NOTE — Progress Notes (Signed)
[facility] PROGRESS NOTE   Devin Riggs BMW:[id number] DOB: [birth year] DOA: 09/28/2020  PCP: Darrow Bussing, MD  Brief History/Interval Summary: 75 y.o. male with a past medical history of hypertension, hyperlipidemia, depression, polymyalgia rheumatica presented to the ED after a syncopal episode.    Consultants: Cardiology.  Neurology  Procedures:    Transthoracic echocardiogram  Carotid Doppler  Antibiotics: Anti-infectives (From admission, onward)   None      Subjective/Interval History: Patient mentions that he is feeling much better this morning.  Denies any chest pain or shortness of breath.  He has no recollection of what happened when he passed out.  Denies any symptoms prior to his syncopal episode.  Denies any history of palpitations recently.    Assessment/Plan:  Syncope Could be due to cardiac etiology.  The patient was found to be in atrial flutter on presentation.  Telemetry shows atrial fibrillation.  Orthostatic vital signs were negative.  TSH was normal.  No focal neurological deficits noted.  Echocardiogram is pending. EEG has also been ordered.  CT angiogram was done which did not show any PE.  Carotid Doppler does not show any significant stenosis. Seen by cardiology who is recommending a neurological input.  Discussed with neurology who will see the patient.  New onset atrial flutter/atrial fibrillation Noted to be in sinus rhythm this morning.  Noted to be on heparin infusion.  Patient is on oral Cardizem.  TSH normal.  Echocardiogram is pending.  Mildly elevated troponin level noted.  Patient denies any chest pain currently.  Defer to cardiology.  Alcohol use Drinks a few bourbons every night.  On CIWA protocol.  Thiamine folate multivitamins.  Watch for signs of withdrawal.  Essential hypertension Noted to be on Cardizem currently.  Lisinopril on hold.  Patient mentioned having a cough to the admitting provider.  Could be due to the  ACE inhibitor.  Chronic cough Could be due to ACE inhibitor.  Chest x-ray did not show any acute findings.  Rib fractures CT angiogram revealed subacute posterior left 10th and 11th rib fracture and age-indeterminate anterior left fourth through sixth rib fracture.  Likely a result of his recent fall from syncopal episode.  No pneumothorax noted.  Incidental cholelithiasis Asymptomatic.  LFTs are normal.  History of hyperlipidemia On Lipitor  Depression Resume home medications at discharge.  DVT Prophylaxis: On IV heparin Code Status: Full code Family Communication: Discussed with the patient.  No family at bedside Disposition Plan: Hopefully return home when improved   Medications:  Scheduled: . diltiazem  30 mg Oral Q6H  . folic acid  1 mg Oral Daily  . multivitamin with minerals  1 tablet Oral Daily  . thiamine  100 mg Oral Daily   Or  . thiamine  100 mg Intravenous Daily   Continuous: . heparin 1,000 Units/hr (09/29/20 0058)   UVO:[id number] **OR** acetaminophen, LORazepam **OR** LORazepam   Objective:  Vital Signs  Vitals:   09/29/20 0045 09/29/20 0451 09/29/20 0807 09/29/20 1130  BP: 137/88  124/73 127/75  Pulse: 77  62 (!) 58  Resp: 20  18 16   Temp: 98.1 F (36.7 C) 98.5 F (36.9 C)  98.9 F (37.2 C)  TempSrc: Oral Oral  Oral  SpO2: 99% 99% 98% 100%  Weight: 80.4 kg     Height: 5' 5.5" (1.664 m)       Intake/Output Summary (Last 24 hours) at 09/29/2020 1135 Last data filed at 09/29/2020 0814 Gross per 24 hour  Intake 63.5 ml  Output 500 ml  Net -436.5 ml   Filed Weights   09/28/20 2001 09/29/20 0045  Weight: 81.6 kg 80.4 kg    General appearance: Awake alert.  In no distress Resp: Clear to auscultation bilaterally.  Normal effort Cardio: S1-S2 is normal regular.  No S3-S4.  No rubs murmurs or bruit No bruising noted over the left chest wall area GI: Abdomen is soft.  Nontender nondistended.  Bowel sounds are present normal.  No masses  organomegaly Extremities: No edema.  Full range of motion of lower extremities. Neurologic: Alert and oriented x3.  No focal neurological deficits.    Lab Results:  Data Reviewed: I have personally reviewed following labs and imaging studies  CBC: Recent Labs  Lab 09/28/20 2055 09/29/20 0052  WBC 6.8 8.5  HGB 15.9 16.9  HCT 46.3 50.0  MCV 93.2 94.0  PLT 214 239    Basic Metabolic Panel: Recent Labs  Lab 09/28/20 2055 09/28/20 2152 09/29/20 0052 09/29/20 0832  NA 138  --   --  137  K 3.7  --  3.5 5.0  CL 107  --   --  106  CO2 22  --   --  26  GLUCOSE 99  --   --  101*  BUN 25*  --   --  19  CREATININE 1.10  --   --  1.07  CALCIUM 8.9  --   --  8.9  MG  --  2.2 2.2  --     GFR: Estimated Creatinine Clearance: 58.9 mL/min (by C-G formula based on SCr of 1.07 mg/dL).  Liver Function Tests: Recent Labs  Lab 09/29/20 0832  AST 25  ALT 26  ALKPHOS 88  BILITOT 0.5  PROT 6.5  ALBUMIN 3.8     CBG: Recent Labs  Lab 09/28/20 2104  GLUCAP 91    Thyroid Function Tests: Recent Labs    09/29/20 0052  TSH 1.870      Radiology Studies: DG Chest 2 View  Result Date: 09/28/2020 CLINICAL DATA:  Syncope with loss of consciousness. Lower left chest pain. Former smoker. EXAM: CHEST - 2 VIEW COMPARISON:  None. FINDINGS: The heart size and mediastinal contours are within normal limits. Both lungs are clear. The visualized skeletal structures are unremarkable. IMPRESSION: No active cardiopulmonary disease. Electronically Signed   By: Burman Nieves M.D.   On: 09/28/2020 22:19   CT Head Wo Contrast  Result Date: 09/28/2020 CLINICAL DATA:  75 year old male with head trauma. EXAM: CT HEAD WITHOUT CONTRAST TECHNIQUE: Contiguous axial images were obtained from the base of the skull through the vertex without intravenous contrast. COMPARISON:  None. FINDINGS: Brain: Mild age-related atrophy and chronic microvascular ischemic changes. There is no acute intracranial  hemorrhage. No mass effect or midline shift. No extra-axial fluid collection. Vascular: No hyperdense vessel or unexpected calcification. Skull: Normal. Negative for fracture or focal lesion. Sinuses/Orbits: There is mild mucoperiosteal thickening of paranasal sinuses. No air-fluid level. The mastoid air cells are clear. Other: None IMPRESSION: 1. No acute intracranial pathology. 2. Mild age-related atrophy and chronic microvascular ischemic changes. Electronically Signed   By: Elgie Collard M.D.   On: 09/28/2020 22:41   CT ANGIO CHEST PE W OR WO CONTRAST  Result Date: 09/29/2020 CLINICAL DATA:  75 year old male with syncope. Left chest pain. Former smoker. EXAM: CT ANGIOGRAPHY CHEST WITH CONTRAST TECHNIQUE: Multidetector CT imaging of the chest was performed using the standard protocol during bolus administration of intravenous contrast. Multiplanar CT image reconstructions  and MIPs were obtained to evaluate the vascular anatomy. CONTRAST:  100mL OMNIPAQUE IOHEXOL 350 MG/ML SOLN COMPARISON:  Chest radiographs 09/28/2020. CT Abdomen and Pelvis [facility] 01/17/2019. FINDINGS: Cardiovascular: Good contrast bolus timing in the pulmonary arterial tree. Respiratory motion at the lung bases but adequate pulmonary artery detail. No focal filling defect identified in the pulmonary arteries to suggest acute pulmonary embolism. Little to no contrast in the aorta. Calcified aortic atherosclerosis. Calcified coronary artery atherosclerosis. Cardiac size at the upper limits of normal. No pericardial effusion. Mediastinum/Nodes: Negative aside from small gastric hiatal hernia. No lymphadenopathy Lungs/Pleura: Major airways are patent. Relatively normal lung volumes. Mild respiratory motion. Well aerated lungs. No pleural effusion or acute appearing pulmonary opacity. Upper Abdomen: Cholelithiasis on series 4, image 90, but the gallbladder appears relatively decompressed. Negative visible noncontrast  liver, spleen, pancreas, adrenal glands, kidneys, and proximal bowel in the upper abdomen. There is fairly extensive diverticulosis of the visible large bowel. Musculoskeletal: Dextroconvex thoracic scoliosis. Thoracic degenerative changes. Mild osteopenia. Subacute appearing unhealed left posterior rib fractures, ribs 10 and 11. Non to minimally displaced anterior left rib fractures are age indeterminate at ribs 4 through 6. Review of the MIP images confirms the above findings. IMPRESSION: 1. Negative for acute pulmonary embolus. No acute lung finding. 2. Subacute but unhealed left posterior 10th and 11th rib fractures. Age indeterminate nondisplaced anterior left 4th through 6th rib fractures. 3. Cholelithiasis. Diverticulosis of the large bowel. Small gastric hiatal hernia. 4. Calcified coronary artery and Aortic Atherosclerosis (ICD10-I70.0). Electronically Signed   By: Odessa FlemingH  Hall M.D.   On: 09/29/2020 04:55   VAS US CAROTID  Result Date: 09/29/2020 Carotid Arterial Duplex Study Indications:       Syncope. Risk Factors:      Hypertension, hyperlipidemia, past history of smoking. Other Factors:     Patient reports family history of stroke. Comparison Study:  No prior studies. Performing Technologist: Jean Rosenthalachel Hodge RDMS,RVT  Examination Guidelines: A complete evaluation includes B-mode imaging, spectral Doppler, color Doppler, and power Doppler as needed of all accessible portions of each vessel. Bilateral testing is considered an integral part of a complete examination. Limited examinations for reoccurring indications may be performed as noted.  Right Carotid Findings: +----------+--------+--------+--------+------------------+--------+           PSV cm/sEDV cm/sStenosisPlaque DescriptionComments +----------+--------+--------+--------+------------------+--------+ CCA Prox  72      17                                         +----------+--------+--------+--------+------------------+--------+ CCA  Distal60      17              heterogenous               +----------+--------+--------+--------+------------------+--------+ ICA Prox  38      11      1-39%                     tortuous +----------+--------+--------+--------+------------------+--------+ ICA Distal86      29                                         +----------+--------+--------+--------+------------------+--------+ ECA       49      9  tortuous +----------+--------+--------+--------+------------------+--------+ +----------+--------+-------+----------------+-------------------+           PSV cm/sEDV cmsDescribe        Arm Pressure (mmHG) +----------+--------+-------+----------------+-------------------+ [id number]            Multiphasic, WNL                    +----------+--------+-------+----------------+-------------------+ +---------+--------+--+--------+--+---------+ VertebralPSV cm/s33EDV cm/s10Antegrade +---------+--------+--+--------+--+---------+  Left Carotid Findings: +----------+--------+--------+--------+---------------------------+--------+           PSV cm/sEDV cm/sStenosisPlaque Description         Comments +----------+--------+--------+--------+---------------------------+--------+ CCA Prox  107     25              hyperechoic and smooth              +----------+--------+--------+--------+---------------------------+--------+ CCA Distal75      15                                                  +----------+--------+--------+--------+---------------------------+--------+ ICA Prox  90      22      1-39%   hypoechoic and heterogenoustortuous +----------+--------+--------+--------+---------------------------+--------+ ICA Distal86      24                                         tortuous +----------+--------+--------+--------+---------------------------+--------+ ECA       111     17                                          tortuous +----------+--------+--------+--------+---------------------------+--------+ +----------+--------+--------+----------------+-------------------+           PSV cm/sEDV cm/sDescribe        Arm Pressure (mmHG) +----------+--------+--------+----------------+-------------------+ [id number]             Multiphasic, WNL                    +----------+--------+--------+----------------+-------------------+ +---------+--------+--+--------+-+---------+ VertebralPSV cm/s32EDV cm/s7Antegrade +---------+--------+--+--------+-+---------+   Summary: Right Carotid: Velocities in the right ICA are consistent with a 1-39% stenosis.                Non-hemodynamically significant plaque <50% noted in the CCA. Left Carotid: Velocities in the left ICA are consistent with a 1-39% stenosis.               Non-hemodynamically significant plaque <50% noted in the CCA. Vertebrals:  Bilateral vertebral arteries demonstrate antegrade flow. Subclavians: Normal flow hemodynamics were seen in bilateral subclavian              arteries. *See table(s) above for measurements and observations.     Preliminary        LOS: 0 days   Blaise Palladino [employer] on [email or web address]  09/29/2020, 11:35 AM

## 2020-09-29 NOTE — Plan of Care (Signed)
  Problem: Clinical Measurements: Goal: Diagnostic test results will improve Outcome: Progressing Goal: Cardiovascular complication will be avoided Outcome: Progressing   Problem: Education: Goal: Knowledge of disease or condition will improve Outcome: Progressing   Problem: Education: Goal: Understanding of medication regimen will improve Outcome: Progressing   Problem: Activity: Goal: Ability to tolerate increased activity will improve Outcome: Progressing   Problem: Cardiac: Goal: Ability to achieve and maintain adequate cardiopulmonary perfusion will improve Outcome: Progressing

## 2020-09-29 NOTE — Progress Notes (Signed)
Carotid duplex bilateral study completed.   Please see CV Proc for preliminary results.   Jashawn Floyd, RDMS, RVT  

## 2020-09-29 NOTE — Consult Note (Signed)
CARDIOLOGY CONSULT NOTE  Patient ID: Joseeduardo Brix, MRN: [id number], DOB/AGE: [birth year] 75 y.o. Admit date: 09/28/2020 Date of Consult: 09/29/2020  Primary Physician: Darrow Bussing, MD Primary Cardiologist:  new Jerol Rufener is a 75 y.o. male who seen 4/69for the evaluation of syncope  at the request of Dr Rito Ehrlich .  No previous cardiac history.  ECG demonstrated atrial flutter with normal QRS duration; "degeneration "into atrial fibrillation and spontaneous reversion to sinus.  ER evaluation included CTA-negative.  troponin borderline elevated without change (25//27)   Chief Complaint: syncope    HPI Cadyn Fann is a 75 y.o. male admitted with syncope an event about whichhe has NO recollection   His last memory was getting up from watching boxing and the next was when he was standing in the kitchen after some period of time with a bump and bruises on the left side of his head, including other his glasses, which were not damaged   No prior hx of syncope or presyncope,orthostatic LH   Fit and no DOE or CP  FOUND in ER to be in atrial flutter of which he was unaware.    Exuberant alcohol intake >=4 ounces of bourbon/day  Thromboembolic risk factors ( age  -2, HTN-1) for a CHADSVASc Score of >=2  Past Medical History:  Diagnosis Date  . Depression   . HLD (hyperlipidemia)   . HTN (hypertension)   . Polymyalgia rheumatica (HCC)       Surgical History: History reviewed. No pertinent surgical history.   Home Meds: Prior to Admission medications   Medication Sig Start Date End Date Taking? Authorizing Provider  atorvastatin (LIPITOR) 10 MG tablet  12/07/18   [provider]  DENTA 5000 PLUS 1.1 % CREA dental cream  01/02/19   [provider]  ibuprofen (ADVIL,MOTRIN) 200 MG tablet Take 200 mg by mouth every 4 (four) hours as needed.    [provider]  lisinopril (ZESTRIL) 20 MG tablet  12/07/18   [provider]   predniSONE (DELTASONE) 1 MG tablet  02/23/19   [provider]  sertraline (ZOLOFT) 100 MG tablet  12/11/18   [provider]    Inpatient Medications:  . diltiazem  30 mg Oral Q6H  . folic acid  1 mg Oral Daily  . multivitamin with minerals  1 tablet Oral Daily  . potassium chloride  40 mEq Oral Once  . thiamine  100 mg Oral Daily   Or  . thiamine  100 mg Intravenous Daily    Allergies:  Allergies  Allergen Reactions  . Sulfa Antibiotics Nausea And Vomiting    Social History   Socioeconomic History  . Marital status: Married    Spouse name: Not on file  . Number of children: Not on file  . Years of education: Not on file  . Highest education level: Not on file  Occupational History  . Not on file  Tobacco Use  . Smoking status: Former Games developer  . Smokeless tobacco: Never Used  Vaping Use  . Vaping Use: Never used  Substance and Sexual Activity  . Alcohol use: Yes    Comment: few bourbons every night  . Drug use: Not on file  . Sexual activity: Not on file  Other Topics Concern  . Not on file  Social History Narrative  . Not on file   Social Determinants of Health   Financial Resource Strain: Not on file  Food Insecurity: Not on file  Transportation Needs:  Not on file  Physical Activity: Not on file  Stress: Not on file  Social Connections: Not on file  Intimate Partner Violence: Not on file     History reviewed. No pertinent family history.   ROS:  Please see the history of present illness.      All other systems reviewed and negative.    Physical Exam: Blood pressure 124/73, pulse 62, temperature 98.5 F (36.9 C), temperature source Oral, resp. rate 18, height 5' 5.5" (1.664 m), weight 80.4 kg, SpO2 98 %. General: Well developed, well nourished male in no acute distress. Head: Normocephalic, atraumatic, sclera non-icteric, no xanthomas, nares are without discharge. EENT: normal  Lymph Nodes:  none Neck: Negative for carotid bruits.  JVD not elevated. Back:without scoliosis kyphosis Lungs: Clear bilaterally to auscultation without wheezes, rales, or rhonchi. Breathing is unlabored. Heart: RRR with S1 S2. No  murmur . No rubs, or gallops appreciated. Abdomen: Soft, non-tender, non-distended with normoactive bowel sounds. No hepatomegaly. No rebound/guarding. No obvious abdominal masses. Msk:  Strength and tone appear normal for age. Extremities: No clubbing or cyanosis. No edema.  Distal pedal pulses are 2+ and equal bilaterally. Skin: Warm and Dry Neuro: Alert and oriented X 3. CN III-XII intact Grossly normal sensory and motor function . Psych:  Responds to questions appropriately with a normal affect.      Labs: Chemistry Recent Labs  Lab 09/28/20 2055 09/29/20 0052 09/29/20 0832  NA 138  --  137  K 3.7 3.5 5.0  CL 107  --  106  CO2 22  --  26  GLUCOSE 99  --  101*  BUN 25*  --  19  CREATININE 1.10  --  1.07  CALCIUM 8.9  --  8.9  PROT  --   --  6.5  ALBUMIN  --   --  3.8  AST  --   --  25  ALT  --   --  26  ALKPHOS  --   --  88  BILITOT  --   --  0.5  GFRNONAA >60  --  >60  ANIONGAP 9  --  5     Hematology Recent Labs  Lab 09/28/20 2055 09/29/20 0052  WBC 6.8 8.5  RBC 4.97 5.32  HGB 15.9 16.9  HCT 46.3 50.0  MCV 93.2 94.0  MCH 32.0 31.8  MCHC 34.3 33.8  RDW 12.5 12.6  PLT 214 239    Cardiac EnzymesNo results for input(s): TROPONINI in the last 168 hours. No results for input(s): TROPIPOC in the last 168 hours.   BNPNo results for input(s): BNP, PROBNP in the last 168 hours.   DDimer  Recent Labs  Lab 09/29/20 0052  DDIMER 1.73*     No results found for: CHOL, HDL, LDLCALC, TRIG  Thyroid Function Tests: Recent Labs    09/29/20 0052  TSH 1.870   Miscellaneous Lab Results  Component Value Date   DDIMER 1.73 (H) 09/29/2020    Radiology/Studies: \  Estimated Creatinine Clearance: 58.9 mL/min (by C-G formula based on SCr of 1.07 mg/dL).  DG Chest 2 View  Result  Date: 09/28/2020 CLINICAL DATA:  Syncope with loss of consciousness. Lower left chest pain. Former smoker. EXAM: CHEST - 2 VIEW COMPARISON:  None. FINDINGS: The heart size and mediastinal contours are within normal limits. Both lungs are clear. The visualized skeletal structures are unremarkable. IMPRESSION: No active cardiopulmonary disease. Electronically Signed   By: Burman Nieves M.D.   On: 09/28/2020 22:19  CT Head Wo Contrast  Result Date: 09/28/2020 CLINICAL DATA:  75 year old male with head trauma. EXAM: CT HEAD WITHOUT CONTRAST TECHNIQUE: Contiguous axial images were obtained from the base of the skull through the vertex without intravenous contrast. COMPARISON:  None. FINDINGS: Brain: Mild age-related atrophy and chronic microvascular ischemic changes. There is no acute intracranial hemorrhage. No mass effect or midline shift. No extra-axial fluid collection. Vascular: No hyperdense vessel or unexpected calcification. Skull: Normal. Negative for fracture or focal lesion. Sinuses/Orbits: There is mild mucoperiosteal thickening of paranasal sinuses. No air-fluid level. The mastoid air cells are clear. Other: None IMPRESSION: 1. No acute intracranial pathology. 2. Mild age-related atrophy and chronic microvascular ischemic changes. Electronically Signed   By: Elgie CollardArash  Radparvar M.D.   On: 09/28/2020 22:41   CT ANGIO CHEST PE W OR WO CONTRAST  Result Date: 09/29/2020 CLINICAL DATA:  75 year old male with syncope. Left chest pain. Former smoker. EXAM: CT ANGIOGRAPHY CHEST WITH CONTRAST TECHNIQUE: Multidetector CT imaging of the chest was performed using the standard protocol during bolus administration of intravenous contrast. Multiplanar CT image reconstructions and MIPs were obtained to evaluate the vascular anatomy. CONTRAST:  100mL OMNIPAQUE IOHEXOL 350 MG/ML SOLN COMPARISON:  Chest radiographs 09/28/2020. CT Abdomen and Pelvis [facility] 01/17/2019. FINDINGS: Cardiovascular: Good  contrast bolus timing in the pulmonary arterial tree. Respiratory motion at the lung bases but adequate pulmonary artery detail. No focal filling defect identified in the pulmonary arteries to suggest acute pulmonary embolism. Little to no contrast in the aorta. Calcified aortic atherosclerosis. Calcified coronary artery atherosclerosis. Cardiac size at the upper limits of normal. No pericardial effusion. Mediastinum/Nodes: Negative aside from small gastric hiatal hernia. No lymphadenopathy Lungs/Pleura: Major airways are patent. Relatively normal lung volumes. Mild respiratory motion. Well aerated lungs. No pleural effusion or acute appearing pulmonary opacity. Upper Abdomen: Cholelithiasis on series 4, image 90, but the gallbladder appears relatively decompressed. Negative visible noncontrast liver, spleen, pancreas, adrenal glands, kidneys, and proximal bowel in the upper abdomen. There is fairly extensive diverticulosis of the visible large bowel. Musculoskeletal: Dextroconvex thoracic scoliosis. Thoracic degenerative changes. Mild osteopenia. Subacute appearing unhealed left posterior rib fractures, ribs 10 and 11. Non to minimally displaced anterior left rib fractures are age indeterminate at ribs 4 through 6. Review of the MIP images confirms the above findings. IMPRESSION: 1. Negative for acute pulmonary embolus. No acute lung finding. 2. Subacute but unhealed left posterior 10th and 11th rib fractures. Age indeterminate nondisplaced anterior left 4th through 6th rib fractures. 3. Cholelithiasis. Diverticulosis of the large bowel. Small gastric hiatal hernia. 4. Calcified coronary artery and Aortic Atherosclerosis (ICD10-I70.0). Electronically Signed   By: Odessa FlemingH  Hall M.D.   On: 09/29/2020 04:55    EKG:09/28/20 atrial flutter-typical with 2: 1 and 3: 1 conduction  Telemetry Personally reviewed atrial fibrillation with a controlled ventricular response with spontaneous reversion to sinus rhythm without a  posttermination pause Sinus rhythm with really variable PP intervals   Assessment and Plan:  Atrial flutter-typical  Syncope  Alcohol intake exuberant   Hypertension    2 issues related in time. If causally related, I would suspect that the onset of atrial flutter could have been assoc with decrease venous return and the triggering of neurally mediated syncope which is the mechanism of syncope in SVT-- fall then with head trauma and concussion with retrograde amnesia.  I cant think of another unifying scenario  We are left however with the concern of syncope, the likelihood of it being intrinsically lifethreatening diminished  by normal ECG but need to see echo. Syncope recurs 20-40% so strategies vary as to how extensively to monitor these people, but for reasons outlined below would anticipate an implantable loop recorder  His atrial flutter with a CHADSVASC score of 3 with attendant risk of stroke and the high risk of recurrence prompts recommendation for catheter ablation as primary therapy.  Because of the high risk of afib in this age cohort, the ILR would serve an additive role for monitoring for recurrence of afib and thus prompting a need to resume anticoagulation which I would anticipate stopping after RFCA.  This can be scheduled electively.  I would ask neuro to see to help garner insights as to potential cause for fall/syncope   Stop hep and begin Apixoban    Sherryl Manges

## 2020-09-29 NOTE — Progress Notes (Signed)
  Echocardiogram 2D Echocardiogram has been performed.  Devin Riggs 09/29/2020, 9:44 AM

## 2020-09-29 NOTE — Progress Notes (Signed)
ANTICOAGULATION CONSULT NOTE - Initial Consult  Pharmacy Consult for Heparin Indication: atrial fibrillation  Allergies  Allergen Reactions  . Sulfa Antibiotics Nausea And Vomiting    Patient Measurements: Height: 5\' 8"  (172.7 cm) Weight: 81.6 kg (180 lb) IBW/kg (Calculated) : 68.4 Heparin Dosing Weight: TBW  Vital Signs: Temp: 98 F (36.7 C) (04/02 2001) Temp Source: Oral (04/02 2001) BP: 118/83 (04/02 2300) Pulse Rate: 78 (04/02 2300)  Labs: Recent Labs    09/28/20 2055 09/28/20 2152  HGB 15.9  --   HCT 46.3  --   PLT 214  --   CREATININE 1.10  --   TROPONINIHS  --  27*    Estimated Creatinine Clearance: 56.1 mL/min (by C-G formula based on SCr of 1.1 mg/dL).   Medical History: History reviewed. No pertinent past medical history.  Medications:  Infusions:    Assessment: Admit with new onset Afib- not on anticoagulation PTA.   Pharmacy consulted to dose IV Heparin. Baseline CBC WNL.  Goal of Therapy:  Heparin level 0.3-0.7 units/ml Monitor platelets by anticoagulation protocol: Yes   Plan:  Heparin 4000 units IV bolus x1 then infusion at 1000 units/hr Check 6h heparin level after infusion starts Daily heparin level & CBC while on heparin Monitor for s/sx of bleeding  2153 PharmD, BCPS 09/29/2020,12:23 AM

## 2020-09-29 NOTE — Plan of Care (Signed)
  Problem: Education: Goal: Knowledge of General Education information will improve Description: Including pain rating scale, medication(s)/side effects and non-pharmacologic comfort measures Outcome: Progressing   Problem: Health Behavior/Discharge Planning: Goal: Ability to manage health-related needs will improve Outcome: Progressing   Problem: Clinical Measurements: Goal: Ability to maintain clinical measurements within normal limits will improve Outcome: Progressing Goal: Diagnostic test results will improve Outcome: Progressing Goal: Cardiovascular complication will be avoided Outcome: Progressing   Problem: Activity: Goal: Risk for activity intolerance will decrease Outcome: Progressing   Problem: Pain Managment: Goal: General experience of comfort will improve Outcome: Progressing   Problem: Safety: Goal: Ability to remain free from injury will improve Outcome: Progressing   Problem: Education: Goal: Knowledge of disease or condition will improve Outcome: Progressing Goal: Understanding of discharge needs will improve Outcome: Progressing   Problem: Health Behavior/Discharge Planning: Goal: Ability to identify changes in lifestyle to reduce recurrence of condition will improve Outcome: Progressing Goal: Identification of resources available to assist in meeting health care needs will improve Outcome: Progressing   Problem: Physical Regulation: Goal: Complications related to the disease process, condition or treatment will be avoided or minimized Outcome: Progressing   Problem: Safety: Goal: Ability to remain free from injury will improve Outcome: Progressing   Problem: Education: Goal: Knowledge of disease or condition will improve Outcome: Progressing Goal: Understanding of medication regimen will improve Outcome: Progressing Goal: Individualized Educational Video(s) Outcome: Progressing   Problem: Activity: Goal: Ability to tolerate increased  activity will improve Outcome: Progressing   Problem: Cardiac: Goal: Ability to achieve and maintain adequate cardiopulmonary perfusion will improve Outcome: Progressing   Problem: Health Behavior/Discharge Planning: Goal: Ability to safely manage health-related needs after discharge will improve Outcome: Progressing   

## 2020-09-29 NOTE — Consult Note (Signed)
Neurology Consultation  Reason for Consult: syncope with fall Referring Physician: Barnie Del, MD  CC: fell in yard and does not remember  History is obtained from: patient and wife at bedside  HPI: Devin Riggs is a 75 y.o. male with a PMHx of HTN, HLD, depression, and PMR who presented to the [facility] ED on 09/28/20 after falling in his yard and not remembering the event. On arrival in ED, patient was found to be in AFlutter (new onset) with rapid rate. Cardiology was consulted for syncope and arrhythmia. Patient was given medications and his rate and rhythm returned to normal after some time. Patient was placed on Heparin drip, now transitioned to Eliquis due to CHADSVASC score of 3.   Per patient and wife, last pm they were preparing dinner and wife asked patient to go outside and light the grill. Patient returned in 2-3 minutes and had not ignited the grill, stating he forgot his wife asked him to. He recalls being "foggy" that evening. He had one drink, but says he puts 2 shots of Bourbon in every drink. He usually has 3-4 of these a night, making his daily intake about 6-8 shots per day.   After above, wife asked him to go back out and light the grill. He then returned and stated the gas tank was empty, but it wasn't. He then returned to grill and came back into the house about 4 minutes later, and family noted abrasions on his left face, hands, and his left shin. Immediately, family thought he fell. Wife states that entire event of conversations about grill and the fall was about 10-15 minutes. Patient does not recall any of that 10-15 minute events.   Wife further elaborates for about a year, patient will forget things she told him, or repeat stories which always happens in the evening (when he is drinking). No history of dementia.   Patient denies history of stroke, seizure, or TBI except for concussion in his young adulthood while playing football.   Patient denies dizziness, chest  pain, palpitations, yelling out, jerking or shaking, prior to or during the event. Family did not notice any seizure like activity.   Patient is very active. 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 He plays golf, walks, and swims. Usually feels great on a daily basis.   Neurology asked to consult for syncope.   ROS: A 14 point ROS was performed and is negative except as noted in the HPI.   Past Medical History:  Diagnosis Date  . Depression   . HLD (hyperlipidemia)   . HTN (hypertension)   . Polymyalgia rheumatica (HCC)    History reviewed. No pertinent family history.  Social History:   reports that he has quit smoking. He has never used smokeless tobacco. He reports current alcohol use. No history on file for drug use. 3-4 Bourbons per night with 2 oz of liquor per drink.   Medications  Current Facility-Administered Medications:  .  acetaminophen (TYLENOL) tablet 650 mg, 650 mg, Oral, Q6H PRN **OR** acetaminophen (TYLENOL) suppository 650 mg, 650 mg, Rectal, Q6H PRN, John Giovanni, MD .  apixaban (ELIQUIS) tablet 5 mg, 5 mg, Oral, BID, Duke Salvia, MD .  atorvastatin (LIPITOR) tablet 10 mg, 10 mg, Oral, Daily, Osvaldo Shipper, MD .  folic acid (FOLVITE) tablet 1 mg, 1 mg, Oral, Daily, John Giovanni, MD, 1 mg at 09/29/20 1029 .  LORazepam (ATIVAN) tablet 1-4 mg, 1-4 mg, Oral, Q1H PRN **  OR** LORazepam (ATIVAN) injection 1-4 mg, 1-4 mg, Intravenous, Q1H PRN, John Giovanni, MD .  multivitamin with minerals tablet 1 tablet, 1 tablet, Oral, Daily, John Giovanni, MD, 1 tablet at 09/29/20 1029 .  thiamine tablet 100 mg, 100 mg, Oral, Daily, 100 mg at 09/29/20 1029 **OR** thiamine (B-1) injection 100 mg, 100 mg, Intravenous, Daily, John Giovanni, MD   Exam: Current vital signs: BP 127/75 (BP Location: Left Arm)   Pulse (!) 58   Temp 98.9 F (37.2 C) (Oral)   Resp 16   Ht 5' 5.5" (1.664 m)   Wt 80.4 kg   SpO2 100%   BMI  29.05 kg/m  Vital signs in last 24 hours: Temp:  [98 F (36.7 C)-98.9 F (37.2 C)] 98.9 F (37.2 C) (04/03 1130) Pulse Rate:  [58-111] 58 (04/03 1130) Resp:  [15-23] 16 (04/03 1130) BP: (106-140)/(73-96) 127/75 (04/03 1130) SpO2:  [93 %-100 %] 100 % (04/03 1130) Weight:  [80.4 kg-81.6 kg] 80.4 kg (04/03 0045)  GENERAL: Awake, alert in NAD. Appears very well.  HEENT: - Normocephalic with abrasion on left face at left eye.  LUNGS - Normal respiratory effort.  CV - RRR on EKG.  ABDOMEN - Soft, nontender Ext: warm, well perfused Psych: Affect light.   NEURO:  Mental Status: AA&Ox3. 3/3 recall immediately, 0/3 recall at 5 mins. Speech/Language: speech is without dysarthria or aphasia. Naming, repetition, fluency, and comprehension intact. Cranial Nerves:  II: PERRL 28mm/brisk. visual fields full.  III, IV, VI: EOMI. Lid elevation symmetric and full.  V: sensation is intact and symmetrical to face. Moves jaw back and forth.  VII: Smile is symmetrical. Able to puff cheeks and raise eyebrows.  VIII: hearing intact to voice IX, X: palate elevation is symmetric. Phonation normal.  XI: normal sternocleidomastoid and trapezius muscle strength BWI:OMBTDH is symmetrical without fasciculations.   Motor: 5/5 strength is all muscle groups.  Tone is normal. Bulk is normal.  Sensation- Intact to light touch bilaterally in all four extremities. Extinction absent to light touch to DSS.  Coordination: FTN intact bilaterally. HKS intact bilaterally. No pronator drift.  DTRs: 1+ bilateral brachioradialis. 0 bilateral patella.  Gait- deferred  Labs I have reviewed labs in epic and the results pertinent to this consultation are: K+ normal, Creatinine 1.07, Glucose 101, UA negative, WBCC 8500, TSH 1.870  CBC    Component Value Date/Time   WBC 8.5 09/29/2020 0052   RBC 5.32 09/29/2020 0052   HGB 16.9 09/29/2020 0052   HCT 50.0 09/29/2020 0052   PLT 239 09/29/2020 0052   MCV 94.0 09/29/2020  0052   MCH 31.8 09/29/2020 0052   MCHC 33.8 09/29/2020 0052   RDW 12.6 09/29/2020 0052    CMP     Component Value Date/Time   NA 137 09/29/2020 0832   K 5.0 09/29/2020 0832   CL 106 09/29/2020 0832   CO2 26 09/29/2020 0832   GLUCOSE 101 (H) 09/29/2020 0832   BUN 19 09/29/2020 0832   CREATININE 1.07 09/29/2020 0832   CALCIUM 8.9 09/29/2020 0832   PROT 6.5 09/29/2020 0832   ALBUMIN 3.8 09/29/2020 0832   AST 25 09/29/2020 0832   ALT 26 09/29/2020 0832   ALKPHOS 88 09/29/2020 0832   BILITOT 0.5 09/29/2020 0832   GFRNONAA >60 09/29/2020 7416   Imaging MD reviewed the images obtained  CT head 1. No acute intracranial pathology. 2. Mild age-related atrophy and chronic microvascular ischemic changes.  Assessment: 75 yo normally active male who presented to  ED yesterday after suffering a fall thought to be a syncopal episode with no memory of event. Aflutter likely the cause but he has amnesia and also has significant EtOh use. No prior history of seizures, no obvious seizure risk factores. AF potentially explain his syncope. However, given his poor recollection of the event, unusual for cardiac event, coupled with EtOH intake and poor memory(colateral provided by wife), reasonable to get MRI and a routine EEG.  Impression: 1. Syncope with retrograde amnesia.  2. Aflutter with rapid rate. 3. R/o syncope due to seizure.  4. ETOH use daily-6-8 oz liquor per pm.   Recommendations: -MRI brain -EEG-will have to be done on Monday due to staffing.  -Continue to control rate/rhythm as you are doing. -Will change Thiamine to high dose here and he should takeThiamine at home.  -Check B12 level-result 378, would prefer it to be higher, start B12 daily.  - Althou my concern for seizure is low, I did discuss with patient and wife to be careful with driving given unclear episode of loss of consciousness and discussed being careful with swimming, baths, heavy machinery, kitchen safety and  general precautions.  Pt seen by Jimmye Norman, NP/Neuro and later by MD. Note/plan to be edited by MD as needed.  Pager: [phone]   NEUROHOSPITALIST ADDENDUM Performed a face to face diagnostic evaluation.   I have reviewed the contents of history and physical exam as documented by PA/ARNP/Resident and agree with above documentation.  I have discussed and formulated the above plan as documented. Edits to the note have been made as needed.  Impression/Key exam findings/Plan: 49M p/w syncope and no recollection of the event. In addition, uses EtOH(not at the time of event) and has had issues with memory. Exam with no focal deficit, oriented x 3. He has 1/3 recall immediately but 0/3 recall at 5 mins, ?underlying early dementia. He has no recollection of this but also no prolonged post ictal period. Althou, suspicion is high for this being cardiac with his new onset Afibb, he has no recollection which is somewhat atypical of cardiac syncope, memory issues that I would not expect with cardiac event and EtOH use history. Given above, recommend MRI Brain non con and rEEG. No Antiseizure medications if above is negative. Discussed being careful with driving and discussed seizure precations. Agree with thiamine replacement(reversible dementia) along with B12 and TSH.  Erick Blinks, MD [facility] [phone]   If 7pm to 7am, please call on call as listed on AMION.

## 2020-09-29 NOTE — Plan of Care (Signed)
  Problem: Education: Goal: Knowledge of General Education information will improve Description: Including pain rating scale, medication(s)/side effects and non-pharmacologic comfort measures Outcome: Progressing   Problem: Health Behavior/Discharge Planning: Goal: Ability to manage health-related needs will improve Outcome: Progressing   Problem: Clinical Measurements: Goal: Ability to maintain clinical measurements within normal limits will improve Outcome: Progressing Goal: Diagnostic test results will improve Outcome: Progressing Goal: Cardiovascular complication will be avoided Outcome: Progressing   Problem: Activity: Goal: Risk for activity intolerance will decrease Outcome: Progressing   Problem: Pain Managment: Goal: General experience of comfort will improve Outcome: Progressing   Problem: Safety: Goal: Ability to remain free from injury will improve Outcome: Progressing   Problem: Education: Goal: Knowledge of disease or condition will improve Outcome: Progressing Goal: Understanding of discharge needs will improve Outcome: Progressing   Problem: Health Behavior/Discharge Planning: Goal: Ability to identify changes in lifestyle to reduce recurrence of condition will improve Outcome: Progressing Goal: Identification of resources available to assist in meeting health care needs will improve Outcome: Progressing   Problem: Physical Regulation: Goal: Complications related to the disease process, condition or treatment will be avoided or minimized Outcome: Progressing   Problem: Safety: Goal: Ability to remain free from injury will improve Outcome: Progressing   Problem: Education: Goal: Knowledge of disease or condition will improve Outcome: Progressing Goal: Understanding of medication regimen will improve Outcome: Progressing Goal: Individualized Educational Video(s) Outcome: Progressing   Problem: Activity: Goal: Ability to tolerate increased  activity will improve Outcome: Progressing   Problem: Cardiac: Goal: Ability to achieve and maintain adequate cardiopulmonary perfusion will improve Outcome: Progressing   Problem: Health Behavior/Discharge Planning: Goal: Ability to safely manage health-related needs after discharge will improve Outcome: Progressing

## 2020-09-29 NOTE — Progress Notes (Signed)
ANTICOAGULATION CONSULT NOTE - Follow Up Consult  Pharmacy Consult for Heparin Indication: atrial fibrillation  Allergies  Allergen Reactions  . Sulfa Antibiotics Nausea And Vomiting    Patient Measurements: Height: 5' 5.5" (166.4 cm) Weight: 80.4 kg (177 lb 4 oz) IBW/kg (Calculated) : 62.65 Heparin Dosing Weight: TBW  Vital Signs: Temp: 98.5 F (36.9 C) (04/03 0451) Temp Source: Oral (04/03 0451) BP: 124/73 (04/03 0807) Pulse Rate: 62 (04/03 0807)  Labs: Recent Labs    09/28/20 2055 09/28/20 2152 09/29/20 0052 09/29/20 0832  HGB 15.9  --  16.9  --   HCT 46.3  --  50.0  --   PLT 214  --  239  --   HEPARINUNFRC  --   --   --  0.35  CREATININE 1.10  --   --   --   TROPONINIHS  --  27* 25*  --     Estimated Creatinine Clearance: 57.3 mL/min (by C-G formula based on SCr of 1.1 mg/dL).   Assessment: Admit with new onset Afib- not on anticoagulation PTA.   Pharmacy consulted to dose IV Heparin.  Today, 09/29/2020  Heparin level therapeutic (0.35) on 1000 units/hr  CBC: WNL  No bleeding or complications reported  Goal of Therapy:  Heparin level 0.3-0.7 units/ml Monitor platelets by anticoagulation protocol: Yes   Plan:  Continue IV heparin infusion at 1000 units/hr Check heparin level in 8hrs to verify therpeutic Daily heparin level & CBC while on heparin Monitor for s/sx of bleeding  Loralee Pacas, PharmD, BCPS Pharmacy: [phone] 09/29/2020,9:15 AM

## 2020-09-30 ENCOUNTER — Inpatient Hospital Stay (HOSPITAL_COMMUNITY)
Admit: 2020-09-30 | Discharge: 2020-09-30 | Disposition: A | Discharge status: Home / Self Care | Payer: Medicare Other | Attending: Internal Medicine | Admitting: Internal Medicine

## 2020-09-30 DIAGNOSIS — R55 Syncope and collapse: Secondary | ICD-10-CM

## 2020-09-30 DIAGNOSIS — I4892 Unspecified atrial flutter: Secondary | ICD-10-CM | POA: Diagnosis not present

## 2020-09-30 DIAGNOSIS — I10 Essential (primary) hypertension: Secondary | ICD-10-CM | POA: Diagnosis not present

## 2020-09-30 LAB — CBC
HCT: 42.1 % (ref 39.0–52.0)
Hemoglobin: 14.2 g/dL (ref 13.0–17.0)
MCH: 31.8 pg (ref 26.0–34.0)
MCHC: 33.7 g/dL (ref 30.0–36.0)
MCV: 94.4 fL (ref 80.0–100.0)
Platelets: 179 10*3/uL (ref 150–400)
RBC: 4.46 MIL/uL (ref 4.22–5.81)
RDW: 12.5 % (ref 11.5–15.5)
WBC: 4.9 10*3/uL (ref 4.0–10.5)
nRBC: 0 % (ref 0.0–0.2)

## 2020-09-30 LAB — COMPREHENSIVE METABOLIC PANEL
ALT: 22 U/L (ref 0–44)
AST: 23 U/L (ref 15–41)
Albumin: 3.6 g/dL (ref 3.5–5.0)
Alkaline Phosphatase: 82 U/L (ref 38–126)
Anion gap: 7 (ref 5–15)
BUN: 24 mg/dL — ABNORMAL HIGH (ref 8–23)
CO2: 24 mmol/L (ref 22–32)
Calcium: 8.6 mg/dL — ABNORMAL LOW (ref 8.9–10.3)
Chloride: 106 mmol/L (ref 98–111)
Creatinine, Ser: 1.2 mg/dL (ref 0.61–1.24)
GFR, Estimated: >60 mL/min (ref >60)
Glucose, Bld: 102 mg/dL — ABNORMAL HIGH (ref 70–99)
Potassium: 4.6 mmol/L (ref 3.5–5.1)
Sodium: 137 mmol/L (ref 135–145)
Total Bilirubin: 0.8 mg/dL (ref 0.3–1.2)
Total Protein: 6.2 g/dL — ABNORMAL LOW (ref 6.5–8.1)

## 2020-09-30 MED ORDER — THIAMINE HCL 100 MG PO TABS: 100.0000 mg | ORAL_TABLET | Freq: Every day | ORAL | 2 refills | Status: AC

## 2020-09-30 MED ORDER — CYANOCOBALAMIN 250 MCG PO TABS: 250.0000 ug | ORAL_TABLET | Freq: Every day | ORAL | 2 refills | Status: AC

## 2020-09-30 MED ORDER — APIXABAN 5 MG PO TABS: 5.0000 mg | ORAL_TABLET | Freq: Two times a day (BID) | ORAL | 2 refills | Status: DC

## 2020-09-30 MED ORDER — FOLIC ACID 1 MG PO TABS: 1.0000 mg | ORAL_TABLET | Freq: Every day | ORAL | 0 refills | Status: DC

## 2020-09-30 NOTE — Procedures (Signed)
Patient Name: Devin Riggs  MRN: [id number]  Epilepsy Attending: Charlsie Quest  Referring Physician/Provider: Jimmye Norman, NP Date: 09/30/2020  Duration: 23.52 mins  Patient history: 75 year old male with syncopal episode.  EEG to evaluate for seizures.  Level of alertness: Awake  AEDs during EEG study: None  Technical aspects: This EEG study was done with scalp electrodes positioned according to the 10-20 International system of electrode placement. Electrical activity was acquired at a sampling rate of 500Hz  and reviewed with a high frequency filter of 70Hz  and a low frequency filter of 1Hz . EEG data were recorded continuously and digitally stored.   Description: The posterior dominant rhythm consists of 10 Hz activity of moderate voltage (25-35 uV) seen predominantly in posterior head regions, symmetric and reactive to eye opening and eye closing. Physiologic photic driving was not seen during photic stimulation. Hyperventilation was not performed.     IMPRESSION: This study is within normal limits. No seizures or epileptiform discharges were seen throughout the recording.  Vernie Vinciguerra 

## 2020-09-30 NOTE — Discharge Summary (Signed)
[facility]  Physician Discharge Summary   Patient ID: Devin Riggs MRN: [id number] DOB/AGE: 1946/02/04 75 y.o.  Admit date: 09/28/2020 Discharge date: 10/01/2020  PCP: Darrow Bussing, MD  DISCHARGE DIAGNOSES:  Syncope thought to be due to atrial arrhythmia New onset atrial flutter/atrial fibrillation History of alcohol use Essential hypertension Rib fractures Incidental cholelithiasis Hyperlipidemia   RECOMMENDATIONS FOR OUTPATIENT FOLLOW UP: 1. Outpatient follow-up with cardiology to be arranged by that service 2. Follow-up with PCP in 1 to 2 weeks    Home Health: None Equipment/Devices: None  CODE STATUS: Full code  DISCHARGE CONDITION: fair  Diet recommendation: Heart healthy  INITIAL HISTORY: 75 y.o.malewitha past medical history of hypertension, hyperlipidemia, depression, polymyalgia rheumaticapresented to the ED after asyncopal episode.   Consultations: [facility]iology, Dr. Graciela Husbands Neurology  Procedures: Transthoracic echocardiogram.  See report below EEG.  See report below. Carotid Doppler.  See report below   HOSPITAL COURSE:   Syncope Could be due to cardiac etiology.  The patient was found to be in atrial flutter on presentation.  Telemetry shows atrial fibrillation.  Orthostatic vital signs were negative.  TSH was normal.  No focal neurological deficits noted.    Patient underwent echocardiogram which showed normal systolic function.  No significant valvular disease noted.  EEG was unremarkable.  CT angiogram chest did not show any PE.  Carotid Dopplers did not show any significant stenosis. Patient was seen by cardiology and neurology.  Will need outpatient follow-up with cardiology for consideration of loop recorder.  New onset atrial flutter/atrial fibrillation Was given Cardizem.  Patient converted to sinus rhythm.  Cardizem was discontinued by cardiology.  TSH normal.  Echocardiogram as below.  Outpatient follow-up with  cardiology.  Eliquis has been initiated for stroke prevention.    Alcohol use Drinks a few bourbons every night.    Maintained on CIWA protocol.  Was counseled.  No signs of withdrawal noted.    Essential hypertension Blood pressure noted to be borderline low.  Patient was on lisinopril at home.  Patient also reported a chronic dry cough.  Will be asked to stop taking the ACE inhibitor at this time.  Follow blood pressure trends in the outpatient setting.    Chronic cough Could be due to ACE inhibitor.  Chest x-ray did not show any acute findings.  ACE inhibitor discontinued.  Rib fractures CT angiogram revealed subacute posterior left 10th and 11th rib fracture and age-indeterminate anterior left fourth through sixth rib fracture.  Likely a result of his recent fall from syncopal episode.  No pneumothorax noted.  Incidental cholelithiasis Asymptomatic.  LFTs are normal.  History of hyperlipidemia On Lipitor  Depression  Patient is stable.  Cleared by cardiology and neurology for discharge.  Okay for discharge today.   PERTINENT LABS:  The results of significant diagnostics from this hospitalization (including imaging, microbiology, ancillary and laboratory) are listed below for reference.    Microbiology: Recent Results (from the past 240 hour(s))  SARS CORONAVIRUS 2 (TAT 6-24 HRS) Nasopharyngeal Nasopharyngeal Swab     Status: None   Collection Time: 09/28/20 10:57 PM   Specimen: Nasopharyngeal Swab  Result Value Ref Range Status   SARS Coronavirus 2 NEGATIVE NEGATIVE Final    Comment: (NOTE) SARS-CoV-2 target nucleic acids are NOT DETECTED.  The SARS-CoV-2 RNA is generally detectable in upper and lower respiratory specimens during the acute phase of infection. Negative results do not preclude SARS-CoV-2 infection, do not rule out co-infections with other pathogens, and should not be used as  the sole basis for treatment or other patient management  decisions. Negative results must be combined with clinical observations, patient history, and epidemiological information. The expected result is Negative.  Fact Sheet for Patients: HairSlick.no  Fact Sheet for Healthcare Providers: quierodirigir.com  This test is not yet approved or cleared by the [place] FDA and  has been authorized for detection and/or diagnosis of SARS-CoV-2 by FDA under an Emergency Use Authorization (EUA). This EUA will remain  in effect (meaning this test can be used) for the duration of the COVID-19 declaration under Se ction 564(b)(1) of the Act, 21 U.S.C. section 360bbb-3(b)(1), unless the authorization is terminated or revoked sooner.  Performed at [facility], [address] [place], [place] [address]      Labs:  COVID-19 Labs  Recent Labs    09/29/20 0052  DDIMER 1.73*    Lab Results  Component Value Date   SARSCOV2NAA NEGATIVE 09/28/2020      Basic Metabolic Panel: Recent Labs  Lab 09/28/20 2055 09/28/20 2152 09/29/20 0052 09/29/20 0832 09/30/20 0410  NA 138  --   --  137 137  K 3.7  --  3.5 5.0 4.6  CL 107  --   --  106 106  CO2 22  --   --  26 24  GLUCOSE 99  --   --  101* 102*  BUN 25*  --   --  19 24*  CREATININE 1.10  --   --  1.07 1.20  CALCIUM 8.9  --   --  8.9 8.6*  MG  --  2.2 2.2  --   --    Liver Function Tests: Recent Labs  Lab 09/29/20 0832 09/30/20 0410  AST 25 23  ALT 26 22  ALKPHOS 88 82  BILITOT 0.5 0.8  PROT 6.5 6.2*  ALBUMIN 3.8 3.6   CBC: Recent Labs  Lab 09/28/20 2055 09/29/20 0052 09/30/20 0934  WBC 6.8 8.5 4.9  HGB 15.9 16.9 14.2  HCT 46.3 50.0 42.1  MCV 93.2 94.0 94.4  PLT 214 239 179    CBG: Recent Labs  Lab 09/28/20 2104  GLUCAP 91     IMAGING STUDIES DG Chest 2 View  Result Date: 09/28/2020 CLINICAL DATA:  Syncope with loss of consciousness. Lower left chest pain. Former smoker. EXAM: CHEST - 2 VIEW  COMPARISON:  None. FINDINGS: The heart size and mediastinal contours are within normal limits. Both lungs are clear. The visualized skeletal structures are unremarkable. IMPRESSION: No active cardiopulmonary disease. Electronically Signed   By: Burman Nieves M.D.   On: 09/28/2020 22:19   CT Head Wo Contrast  Result Date: 09/28/2020 CLINICAL DATA:  75 year old male with head trauma. EXAM: CT HEAD WITHOUT CONTRAST TECHNIQUE: Contiguous axial images were obtained from the base of the skull through the vertex without intravenous contrast. COMPARISON:  None. FINDINGS: Brain: Mild age-related atrophy and chronic microvascular ischemic changes. There is no acute intracranial hemorrhage. No mass effect or midline shift. No extra-axial fluid collection. Vascular: No hyperdense vessel or unexpected calcification. Skull: Normal. Negative for fracture or focal lesion. Sinuses/Orbits: There is mild mucoperiosteal thickening of paranasal sinuses. No air-fluid level. The mastoid air cells are clear. Other: None IMPRESSION: 1. No acute intracranial pathology. 2. Mild age-related atrophy and chronic microvascular ischemic changes. Electronically Signed   By: Elgie Collard M.D.   On: 09/28/2020 22:41   CT ANGIO CHEST PE W OR WO CONTRAST  Result Date: 09/29/2020 CLINICAL DATA:  75 year old  male with syncope. Left chest pain. Former smoker. EXAM: CT ANGIOGRAPHY CHEST WITH CONTRAST TECHNIQUE: Multidetector CT imaging of the chest was performed using the standard protocol during bolus administration of intravenous contrast. Multiplanar CT image reconstructions and MIPs were obtained to evaluate the vascular anatomy. CONTRAST:  OMNIPAQUE IOHEXOL 350 MG/ML SOLN COMPARISON:  Chest radiographs 09/28/2020. CT Abdomen and Pelvis [facility] 01/17/2019. FINDINGS: Cardiovascular: Good contrast bolus timing in the pulmonary arterial tree. Respiratory motion at the lung bases but adequate pulmonary artery detail.  No focal filling defect identified in the pulmonary arteries to suggest acute pulmonary embolism. Little to no contrast in the aorta. Calcified aortic atherosclerosis. Calcified coronary artery atherosclerosis. Cardiac size at the upper limits of normal. No pericardial effusion. Mediastinum/Nodes: Negative aside from small gastric hiatal hernia. No lymphadenopathy Lungs/Pleura: Major airways are patent. Relatively normal lung volumes. Mild respiratory motion. Well aerated lungs. No pleural effusion or acute appearing pulmonary opacity. Upper Abdomen: Cholelithiasis on series 4, image 90, but the gallbladder appears relatively decompressed. Negative visible noncontrast liver, spleen, pancreas, adrenal glands, kidneys, and proximal bowel in the upper abdomen. There is fairly extensive diverticulosis of the visible large bowel. Musculoskeletal: Dextroconvex thoracic scoliosis. Thoracic degenerative changes. Mild osteopenia. Subacute appearing unhealed left posterior rib fractures, ribs 10 and 11. Non to minimally displaced anterior left rib fractures are age indeterminate at ribs 4 through 6. Review of the MIP images confirms the above findings. IMPRESSION: 1. Negative for acute pulmonary embolus. No acute lung finding. 2. Subacute but unhealed left posterior 10th and 11th rib fractures. Age indeterminate nondisplaced anterior left 4th through 6th rib fractures. 3. Cholelithiasis. Diverticulosis of the large bowel. Small gastric hiatal hernia. 4. Calcified coronary artery and Aortic Atherosclerosis (ICD10-I70.0). Electronically Signed   By: Odessa Fleming M.D.   On: 09/29/2020 04:55   MR BRAIN WO CONTRAST  Result Date: 09/29/2020 CLINICAL DATA:  Syncope, simple, normal neuro exam.  Possible fall. EXAM: MRI HEAD WITHOUT CONTRAST TECHNIQUE: Multiplanar, multiecho pulse sequences of the brain and surrounding structures were obtained without intravenous contrast. COMPARISON:  CT head without contrast 09/28/2020 FINDINGS:  Brain: No acute infarct, hemorrhage, or mass lesion is present. Moderate generalized atrophy and white matter changes are present bilaterally. The ventricles are proportionate to the degree of atrophy. Basal ganglia are within limits. No acute or focal cortical abnormalities are present. The internal auditory canals are within normal limits. The brainstem and cerebellum are within normal limits. Vascular: Flow is present in the major intracranial arteries. Skull and upper cervical spine: The craniocervical junction is normal. Upper cervical spine is within normal limits. Marrow signal is unremarkable. Sinuses/Orbits: Mild mucosal thickening is present in the anterior ethmoid air cells and inferior maxillary sinuses bilaterally. No fluid levels are present. Minimal fluid is present in the mastoid air cells bilaterally. No obstructing nasopharyngeal lesion is present. The globes and orbits are within normal limits. IMPRESSION: 1. No acute intracranial abnormality. 2. Moderate generalized atrophy and white matter disease likely reflects the sequela of chronic microvascular ischemia. Electronically Signed   By: Marin Roberts M.D.   On: 09/29/2020 16:47   EEG adult  Result Date: 09/30/2020 Charlsie Quest, MD     09/30/2020 12:04 PM Patient Name: Dorwin Fitzhenry MRN: [id number] Epilepsy Attending: Charlsie Quest Referring Physician/Provider: Jimmye Norman, NP Date: 09/30/2020 Duration: 23.52 mins Patient history: 75 year old male with syncopal episode.  EEG to evaluate for seizures. Level of alertness: Awake AEDs during EEG study: None Technical aspects: This EEG study  was done with scalp electrodes positioned according to the 10-20 International system of electrode placement. Electrical activity was acquired at a sampling rate of 500Hz  and reviewed with a high frequency filter of 70Hz  and a low frequency filter of 1Hz . EEG data were recorded continuously and digitally stored. Description: The posterior  dominant rhythm consists of 10 Hz activity of moderate voltage (25-35 uV) seen predominantly in posterior head regions, symmetric and reactive to eye opening and eye closing. Physiologic photic driving was not seen during photic stimulation. Hyperventilation was not performed.   IMPRESSION: This study is within normal limits. No seizures or epileptiform discharges were seen throughout the recording. Charlsie Questriyanka O Yadav   ECHOCARDIOGRAM COMPLETE  Result Date: 09/29/2020    ECHOCARDIOGRAM REPORT   Patient Name:   Alona BeneMIDDLETON Finlay Date of Exam: 09/29/2020 Medical Rec #:  [id number]           Height:       65.5 in Accession #:    [id number]          Weight:       177.2 lb Date of Birth:  05/23/1946           BSA:          1.890 m Patient Age:    75 years            BP:           124/73 mmHg Patient Gender: M                   HR:           56 bpm. Exam Location:  Inpatient Procedure: 2D Echo, Cardiac Doppler and Color Doppler Indications:    I48.92* Unspecified atrial flutter  History:        Patient has no prior history of Echocardiogram examinations.                 Risk Factors:Hypertension and Dyslipidemia.  Sonographer:    Elmarie Shileyiffany Dance Referring Phys: [id number] VASUNDHRA RATHORE IMPRESSIONS  1. Left ventricular ejection fraction, by estimation, is 55 to 60%. The left ventricle has normal function. The left ventricle has no regional wall motion abnormalities. Left ventricular diastolic parameters were normal.  2. Right ventricular systolic function is normal. The right ventricular size is normal.  3. The mitral valve is normal in structure. No evidence of mitral valve regurgitation.  4. The aortic valve is grossly normal. There is mild calcification of the aortic valve. There is mild thickening of the aortic valve. Aortic valve regurgitation is not visualized. No aortic stenosis is present.  5. Aortic dilatation noted. There is borderline dilatation of the aortic root, measuring 37 mm.  6. The inferior vena cava is  normal in size with greater than 50% respiratory variability, suggesting right atrial pressure of 3 mmHg. Comparison(s): No prior Echocardiogram. FINDINGS  Left Ventricle: Left ventricular ejection fraction, by estimation, is 55 to 60%. The left ventricle has normal function. The left ventricle has no regional wall motion abnormalities. The left ventricular internal cavity size was normal in size. There is  no concentric left ventricular hypertrophy. Left ventricular diastolic parameters were normal. Right Ventricle: The right ventricular size is normal. No increase in right ventricular wall thickness. Right ventricular systolic function is normal. Left Atrium: Left atrial size was normal in size. Right Atrium: Right atrial size was normal in size. Pericardium: There is no evidence of pericardial effusion. Mitral Valve: The mitral valve is normal in structure.  No evidence of mitral valve regurgitation. Tricuspid Valve: The tricuspid valve is normal in structure. Tricuspid valve regurgitation is not demonstrated. No evidence of tricuspid stenosis. Aortic Valve: The aortic valve is grossly normal. There is mild calcification of the aortic valve. There is mild thickening of the aortic valve. Aortic valve regurgitation is not visualized. No aortic stenosis is present. Pulmonic Valve: The pulmonic valve was grossly normal. Pulmonic valve regurgitation is not visualized. No evidence of pulmonic stenosis. Aorta: Aortic dilatation noted. There is borderline dilatation of the aortic root, measuring 37 mm. Venous: The inferior vena cava is normal in size with greater than 50% respiratory variability, suggesting right atrial pressure of 3 mmHg. IAS/Shunts: The atrial septum is grossly normal.  LEFT VENTRICLE PLAX 2D LVIDd:         4.70 cm  Diastology LVIDs:         3.00 cm  LV e' medial:    9.25 cm/s LV PW:         1.30 cm  LV E/e' medial:  8.3 LV IVS:        1.30 cm  LV e' lateral:   10.60 cm/s LVOT diam:     2.40 cm  LV E/e'  lateral: 7.3 LV SV:         100 LV SV Index:   53 LVOT Area:     4.52 cm  RIGHT VENTRICLE             IVC RV Basal diam:  3.00 cm     IVC diam: 1.60 cm RV Mid diam:    1.90 cm RV S prime:     11.70 cm/s TAPSE (M-mode): 2.1 cm LEFT ATRIUM             Index       RIGHT ATRIUM           Index LA diam:        4.20 cm 2.22 cm/m  RA Area:     14.80 cm LA Vol (A2C):   51.3 ml 27.14 ml/m RA Volume:   31.50 ml  16.67 ml/m LA Vol (A4C):   57.3 ml 30.32 ml/m LA Biplane Vol: 54.4 ml 28.78 ml/m  AORTIC VALVE LVOT Vmax:   94.00 cm/s LVOT Vmean:  64.200 cm/s LVOT VTI:    0.220 m  AORTA Ao Root diam: 3.70 cm Ao Asc diam:  3.45 cm MITRAL VALVE MV Area (PHT): 4.15 cm    SHUNTS MV Decel Time: 183 msec    Systemic VTI:  0.22 m MV E velocity: 77.10 cm/s  Systemic Diam: 2.40 cm MV A velocity: 48.00 cm/s MV E/A ratio:  1.61 Riley Lam MD Electronically signed by Riley Lam MD Signature Date/Time: 09/29/2020/1:57:30 PM    Final    VAS US CAROTID  Result Date: 09/29/2020 Carotid Arterial Duplex Study Indications:       Syncope. Risk Factors:      Hypertension, hyperlipidemia, past history of smoking. Other Factors:     Patient reports family history of stroke. Comparison Study:  No prior studies. Performing Technologist: Jean Rosenthal RDMS,RVT  Examination Guidelines: A complete evaluation includes B-mode imaging, spectral Doppler, color Doppler, and power Doppler as needed of all accessible portions of each vessel. Bilateral testing is considered an integral part of a complete examination. Limited examinations for reoccurring indications may be performed as noted.  Right Carotid Findings: +----------+--------+--------+--------+------------------+--------+           PSV cm/sEDV cm/sStenosisPlaque DescriptionComments +----------+--------+--------+--------+------------------+--------+ CCA Prox  72      17                                          +----------+--------+--------+--------+------------------+--------+ CCA Distal60      17              heterogenous               +----------+--------+--------+--------+------------------+--------+ ICA Prox  38      11      1-39%                     tortuous +----------+--------+--------+--------+------------------+--------+ ICA Distal86      29                                         +----------+--------+--------+--------+------------------+--------+ ECA       49      9                                 tortuous +----------+--------+--------+--------+------------------+--------+ +----------+--------+-------+----------------+-------------------+           PSV cm/sEDV cmsDescribe        Arm Pressure (mmHG) +----------+--------+-------+----------------+-------------------+ [id number]            Multiphasic, WNL                    +----------+--------+-------+----------------+-------------------+ +---------+--------+--+--------+--+---------+ VertebralPSV cm/s33EDV cm/s10Antegrade +---------+--------+--+--------+--+---------+  Left Carotid Findings: +----------+--------+--------+--------+---------------------------+--------+           PSV cm/sEDV cm/sStenosisPlaque Description         Comments +----------+--------+--------+--------+---------------------------+--------+ CCA Prox  107     25              hyperechoic and smooth              +----------+--------+--------+--------+---------------------------+--------+ CCA Distal75      15                                                  +----------+--------+--------+--------+---------------------------+--------+ ICA Prox  90      22      1-39%   hypoechoic and heterogenoustortuous +----------+--------+--------+--------+---------------------------+--------+ ICA Distal86      24                                         tortuous  +----------+--------+--------+--------+---------------------------+--------+ ECA       111     17                                         tortuous +----------+--------+--------+--------+---------------------------+--------+ +----------+--------+--------+----------------+-------------------+           PSV cm/sEDV cm/sDescribe        Arm Pressure (mmHG) +----------+--------+--------+----------------+-------------------+ [id number]             Multiphasic, WNL                    +----------+--------+--------+----------------+-------------------+ +---------+--------+--+--------+-+---------+  VertebralPSV cm/s32EDV cm/s7Antegrade +---------+--------+--+--------+-+---------+   Summary: Right Carotid: Velocities in the right ICA are consistent with a 1-39% stenosis.                Non-hemodynamically significant plaque <50% noted in the CCA. Left Carotid: Velocities in the left ICA are consistent with a 1-39% stenosis.               Non-hemodynamically significant plaque <50% noted in the CCA. Vertebrals:  Bilateral vertebral arteries demonstrate antegrade flow. Subclavians: Normal flow hemodynamics were seen in bilateral subclavian              arteries. *See table(s) above for measurements and observations.  Electronically signed by Sherald Hess MD on 09/29/2020 at 11:43:14 AM.    Final     DISCHARGE EXAMINATION: Vitals:   09/29/20 1404 09/29/20 2112 09/30/20 0517 09/30/20 1322  BP: 123/78 139/75 123/75 (!) 143/71  Pulse: 64 (!) 56 (!) 49 (!) 59  Resp: Temp: 98.7 F (37.1 C) 98.7 F (37.1 C) 98 F (36.7 C) 98.2 F (36.8 C)  TempSrc: Oral Oral Oral Oral  SpO2: 96% 97% 99% 97%  Weight:      Height:       General appearance: Awake alert.  In no distress Resp: Clear to auscultation bilaterally.  Normal effort Cardio: S1-S2 is normal regular.  No S3-S4.  No rubs murmurs or bruit GI: Abdomen is soft.  Nontender nondistended.  Bowel sounds are present normal.  No  masses organomegaly    DISPOSITION: Home  Discharge Instructions    Call MD for:  difficulty breathing, headache or visual disturbances   Complete by: As directed    Call MD for:  extreme fatigue   Complete by: As directed    Call MD for:  persistant dizziness or light-headedness   Complete by: As directed    Call MD for:  persistant nausea and vomiting   Complete by: As directed    Call MD for:  severe uncontrolled pain   Complete by: As directed    Call MD for:  temperature >100.4   Complete by: As directed    Diet - low sodium heart healthy   Complete by: As directed    Discharge instructions   Complete by: As directed    Please be sure to see your primary care provider in 1 week for posthospitalization follow-up.  Please avoid driving for the next 6 months as recommended by the neurologist.  Take your medications as prescribed.   You will now be on a blood thinner so watch out for signs of bleeding including blood in stool, blood in urine, bleeding from any other site, black-colored stool.  Also seek attention if you feel suddenly very fatigued dizzy lightheaded.  The lisinopril which he used for blood pressure has been discontinued as it may be causing a chronic cough.  Blood pressures have been very well controlled here in the hospital.  Talk to your PCP at follow up about alternative blood pressure medications if indicated.  You were cared for by a hospitalist during your hospital stay. If you have any questions about your discharge medications or the care you received while you were in the hospital after you are discharged, you can call the unit and asked to speak with the hospitalist on call if the hospitalist that took care of you is not available. Once you are discharged, your primary care physician will handle any further medical issues. Please  note that NO REFILLS for any discharge medications will be authorized once you are discharged, as it is imperative that you return to  your primary care physician (or establish a relationship with a primary care physician if you do not have one) for your aftercare needs so that they can reassess your need for medications and monitor your lab values. If you do not have a primary care physician, you can call [phone] for a physician referral.   Increase activity slowly   Complete by: As directed         Allergies as of 09/30/2020      Reactions   Sulfa Antibiotics Nausea And Vomiting      Medication List    STOP taking these medications   ibuprofen 200 MG tablet Commonly known as: ADVIL   lisinopril 20 MG tablet Commonly known as: ZESTRIL     TAKE these medications   apixaban 5 MG Tabs tablet Commonly known as: ELIQUIS Take 1 tablet (5 mg total) by mouth 2 (two) times daily.   atorvastatin 10 MG tablet Commonly known as: LIPITOR Take 10 mg by mouth every morning.   FIBER ADULT GUMMIES PO Take 1 tablet by mouth every morning.   folic acid 1 MG tablet Commonly known as: FOLVITE Take 1 tablet (1 mg total) by mouth daily.   multivitamin with minerals Tabs tablet Take 1 tablet by mouth every morning.   sertraline 100 MG tablet Commonly known as: ZOLOFT Take 150 mg by mouth every morning.   sodium fluoride 1.1 % Crea dental cream Commonly known as: PREVIDENT 5000 PLUS Place 1 application onto teeth See admin instructions. Brush at bedtime for 2 minutes, spit out, don't eat, drink or rinse for 30 minutes .   thiamine 100 MG tablet Take 1 tablet (100 mg total) by mouth daily.   vitamin B-12 250 MCG tablet Commonly known as: CYANOCOBALAMIN Take 1 tablet (250 mcg total) by mouth daily.         Follow-up Information    Koirala, Dibas, MD. Schedule an appointment as soon as possible for a visit in 1 week(s).   Specialty: Family Medicine Contact information: [address] [place] [address] [phone]        Duke Salvia, MD Follow up.   Specialty: Cardiology Why:  his office should call you with an appointment. Contact information: [address] [place] [address] [phone]               TOTAL DISCHARGE TIME: 35 minutes  Jinx Gilden Rito Ehrlich  [facility] Pager on [email or web address]  10/01/2020, 10:24 AM

## 2020-09-30 NOTE — Progress Notes (Addendum)
Progress Note  Patient Name: Devin Riggs Date of Encounter: 09/30/2020  [facility] Cardiologist: Sherryl Manges, MD new   Subjective   Pt feels well this morning. He is not thrilled at the recommendation to avoid driving.  Inpatient Medications    Scheduled Meds: . apixaban  5 mg Oral BID  . atorvastatin  10 mg Oral Daily  . folic acid  1 mg Oral Daily  . multivitamin with minerals  1 tablet Oral Daily  . [START ON 10/08/2020] thiamine injection  100 mg Intravenous Daily  . vitamin B-12  250 mcg Oral Daily   Continuous Infusions: . thiamine injection 500 mg (09/30/20 0509)   Followed by  . [START ON 10/02/2020] thiamine injection     PRN Meds: acetaminophen **OR** acetaminophen, LORazepam **OR** LORazepam   Vital Signs    Vitals:   09/29/20 1130 09/29/20 1404 09/29/20 2112 09/30/20 0517  BP: 127/75 123/78 139/75 123/75  Pulse: (!) 58 64 (!) 56 (!) 49  Resp: 16 16 18 18   Temp: 98.9 F (37.2 C) 98.7 F (37.1 C) 98.7 F (37.1 C) 98 F (36.7 C)  TempSrc: Oral Oral Oral Oral  SpO2: 100% 96% 97% 99%  Weight:      Height:        Intake/Output Summary (Last 24 hours) at 09/30/2020 1014 Last data filed at 09/30/2020 0509 Gross per 24 hour  Intake 640 ml  Output 900 ml  Net -260 ml   Last 3 Weights 09/29/2020 09/28/2020  Weight (lbs) 177 lb 4 oz 180 lb  Weight (kg) 80.4 kg 81.647 kg      Telemetry    Sinus bradycardia with HR in the 40-50s - Personally Reviewed  ECG    No new tracings - Personally Reviewed  Physical Exam   GEN: No acute distress.   Neck: No JVD Cardiac: RRR, no murmurs, rubs, or gallops.  Respiratory: Clear to auscultation bilaterally. GI: Soft, nontender, non-distended  MS: No edema; No deformity. Neuro:  Nonfocal  Psych: Normal affect   Labs    High Sensitivity Troponin:   Recent Labs  Lab 09/28/20 2152 09/29/20 0052  TROPONINIHS 27* 25*      Chemistry Recent Labs  Lab 09/28/20 2055 09/29/20 0052 09/29/20 0832  09/30/20 0410  NA 138  --  137 137  K 3.7 3.5 5.0 4.6  CL 107  --  106 106  CO2 22  --  26 24  GLUCOSE 99  --  101* 102*  BUN 25*  --  19 24*  CREATININE 1.10  --  1.07 1.20  CALCIUM 8.9  --  8.9 8.6*  PROT  --   --  6.5 6.2*  ALBUMIN  --   --  3.8 3.6  AST  --   --  25 23  ALT  --   --  26 22  ALKPHOS  --   --  88 82  BILITOT  --   --  0.5 0.8  GFRNONAA >60  --  >60 >60  ANIONGAP 9  --  5 7     Hematology Recent Labs  Lab 09/28/20 2055 09/29/20 0052  WBC 6.8 8.5  RBC 4.97 5.32  HGB 15.9 16.9  HCT 46.3 50.0  MCV 93.2 94.0  MCH 32.0 31.8  MCHC 34.3 33.8  RDW 12.5 12.6  PLT 214 239    BNPNo results for input(s): BNP, PROBNP in the last 168 hours.   DDimer  Recent Labs  Lab 09/29/20  1610  DDIMER 1.73*     Radiology    DG Chest 2 View  Result Date: 09/28/2020 CLINICAL DATA:  Syncope with loss of consciousness. Lower left chest pain. Former smoker. EXAM: CHEST - 2 VIEW COMPARISON:  None. FINDINGS: The heart size and mediastinal contours are within normal limits. Both lungs are clear. The visualized skeletal structures are unremarkable. IMPRESSION: No active cardiopulmonary disease. Electronically Signed   By: Burman Nieves M.D.   On: 09/28/2020 22:19   CT Head Wo Contrast  Result Date: 09/28/2020 CLINICAL DATA:  75 year old male with head trauma. EXAM: CT HEAD WITHOUT CONTRAST TECHNIQUE: Contiguous axial images were obtained from the base of the skull through the vertex without intravenous contrast. COMPARISON:  None. FINDINGS: Brain: Mild age-related atrophy and chronic microvascular ischemic changes. There is no acute intracranial hemorrhage. No mass effect or midline shift. No extra-axial fluid collection. Vascular: No hyperdense vessel or unexpected calcification. Skull: Normal. Negative for fracture or focal lesion. Sinuses/Orbits: There is mild mucoperiosteal thickening of paranasal sinuses. No air-fluid level. The mastoid air cells are clear. Other: None  IMPRESSION: 1. No acute intracranial pathology. 2. Mild age-related atrophy and chronic microvascular ischemic changes. Electronically Signed   By: Elgie Collard M.D.   On: 09/28/2020 22:41   CT ANGIO CHEST PE W OR WO CONTRAST  Result Date: 09/29/2020 CLINICAL DATA:  75 year old male with syncope. Left chest pain. Former smoker. EXAM: CT ANGIOGRAPHY CHEST WITH CONTRAST TECHNIQUE: Multidetector CT imaging of the chest was performed using the standard protocol during bolus administration of intravenous contrast. Multiplanar CT image reconstructions and MIPs were obtained to evaluate the vascular anatomy. CONTRAST:  OMNIPAQUE IOHEXOL 350 MG/ML SOLN COMPARISON:  Chest radiographs 09/28/2020. CT Abdomen and Pelvis [facility] 01/17/2019. FINDINGS: Cardiovascular: Good contrast bolus timing in the pulmonary arterial tree. Respiratory motion at the lung bases but adequate pulmonary artery detail. No focal filling defect identified in the pulmonary arteries to suggest acute pulmonary embolism. Little to no contrast in the aorta. Calcified aortic atherosclerosis. Calcified coronary artery atherosclerosis. Cardiac size at the upper limits of normal. No pericardial effusion. Mediastinum/Nodes: Negative aside from small gastric hiatal hernia. No lymphadenopathy Lungs/Pleura: Major airways are patent. Relatively normal lung volumes. Mild respiratory motion. Well aerated lungs. No pleural effusion or acute appearing pulmonary opacity. Upper Abdomen: Cholelithiasis on series 4, image 90, but the gallbladder appears relatively decompressed. Negative visible noncontrast liver, spleen, pancreas, adrenal glands, kidneys, and proximal bowel in the upper abdomen. There is fairly extensive diverticulosis of the visible large bowel. Musculoskeletal: Dextroconvex thoracic scoliosis. Thoracic degenerative changes. Mild osteopenia. Subacute appearing unhealed left posterior rib fractures, ribs 10 and 11. Non to  minimally displaced anterior left rib fractures are age indeterminate at ribs 4 through 6. Review of the MIP images confirms the above findings. IMPRESSION: 1. Negative for acute pulmonary embolus. No acute lung finding. 2. Subacute but unhealed left posterior 10th and 11th rib fractures. Age indeterminate nondisplaced anterior left 4th through 6th rib fractures. 3. Cholelithiasis. Diverticulosis of the large bowel. Small gastric hiatal hernia. 4. Calcified coronary artery and Aortic Atherosclerosis (ICD10-I70.0). Electronically Signed   By: Odessa Fleming M.D.   On: 09/29/2020 04:55   MR BRAIN WO CONTRAST  Result Date: 09/29/2020 CLINICAL DATA:  Syncope, simple, normal neuro exam.  Possible fall. EXAM: MRI HEAD WITHOUT CONTRAST TECHNIQUE: Multiplanar, multiecho pulse sequences of the brain and surrounding structures were obtained without intravenous contrast. COMPARISON:  CT head without contrast 09/28/2020 FINDINGS: Brain: No acute  infarct, hemorrhage, or mass lesion is present. Moderate generalized atrophy and white matter changes are present bilaterally. The ventricles are proportionate to the degree of atrophy. Basal ganglia are within limits. No acute or focal cortical abnormalities are present. The internal auditory canals are within normal limits. The brainstem and cerebellum are within normal limits. Vascular: Flow is present in the major intracranial arteries. Skull and upper cervical spine: The craniocervical junction is normal. Upper cervical spine is within normal limits. Marrow signal is unremarkable. Sinuses/Orbits: Mild mucosal thickening is present in the anterior ethmoid air cells and inferior maxillary sinuses bilaterally. No fluid levels are present. Minimal fluid is present in the mastoid air cells bilaterally. No obstructing nasopharyngeal lesion is present. The globes and orbits are within normal limits. IMPRESSION: 1. No acute intracranial abnormality. 2. Moderate generalized atrophy and white  matter disease likely reflects the sequela of chronic microvascular ischemia. Electronically Signed   By: Marin Roberts M.D.   On: 09/29/2020 16:47   ECHOCARDIOGRAM COMPLETE  Result Date: 09/29/2020    ECHOCARDIOGRAM REPORT   Patient Name:   BREVIN MCFADDEN Date of Exam: 09/29/2020 Medical Rec #:  [id number]           Height:       65.5 in Accession #:    [id number]          Weight:       177.2 lb Date of Birth:  13-Aug-1945           BSA:          1.890 m Patient Age:    75 years            BP:           124/73 mmHg Patient Gender: M                   HR:           56 bpm. Exam Location:  Inpatient Procedure: 2D Echo, Cardiac Doppler and Color Doppler Indications:    I48.92* Unspecified atrial flutter  History:        Patient has no prior history of Echocardiogram examinations.                 Risk Factors:Hypertension and Dyslipidemia.  Sonographer:    Elmarie Shiley Dance Referring Phys: [id number] VASUNDHRA RATHORE IMPRESSIONS  1. Left ventricular ejection fraction, by estimation, is 55 to 60%. The left ventricle has normal function. The left ventricle has no regional wall motion abnormalities. Left ventricular diastolic parameters were normal.  2. Right ventricular systolic function is normal. The right ventricular size is normal.  3. The mitral valve is normal in structure. No evidence of mitral valve regurgitation.  4. The aortic valve is grossly normal. There is mild calcification of the aortic valve. There is mild thickening of the aortic valve. Aortic valve regurgitation is not visualized. No aortic stenosis is present.  5. Aortic dilatation noted. There is borderline dilatation of the aortic root, measuring 37 mm.  6. The inferior vena cava is normal in size with greater than 50% respiratory variability, suggesting right atrial pressure of 3 mmHg. Comparison(s): No prior Echocardiogram. FINDINGS  Left Ventricle: Left ventricular ejection fraction, by estimation, is 55 to 60%. The left ventricle has normal  function. The left ventricle has no regional wall motion abnormalities. The left ventricular internal cavity size was normal in size. There is  no concentric left ventricular hypertrophy. Left ventricular diastolic parameters were normal. Right Ventricle: The  right ventricular size is normal. No increase in right ventricular wall thickness. Right ventricular systolic function is normal. Left Atrium: Left atrial size was normal in size. Right Atrium: Right atrial size was normal in size. Pericardium: There is no evidence of pericardial effusion. Mitral Valve: The mitral valve is normal in structure. No evidence of mitral valve regurgitation. Tricuspid Valve: The tricuspid valve is normal in structure. Tricuspid valve regurgitation is not demonstrated. No evidence of tricuspid stenosis. Aortic Valve: The aortic valve is grossly normal. There is mild calcification of the aortic valve. There is mild thickening of the aortic valve. Aortic valve regurgitation is not visualized. No aortic stenosis is present. Pulmonic Valve: The pulmonic valve was grossly normal. Pulmonic valve regurgitation is not visualized. No evidence of pulmonic stenosis. Aorta: Aortic dilatation noted. There is borderline dilatation of the aortic root, measuring 37 mm. Venous: The inferior vena cava is normal in size with greater than 50% respiratory variability, suggesting right atrial pressure of 3 mmHg. IAS/Shunts: The atrial septum is grossly normal.  LEFT VENTRICLE PLAX 2D LVIDd:         4.70 cm  Diastology LVIDs:         3.00 cm  LV e' medial:    9.25 cm/s LV PW:         1.30 cm  LV E/e' medial:  8.3 LV IVS:        1.30 cm  LV e' lateral:   10.60 cm/s LVOT diam:     2.40 cm  LV E/e' lateral: 7.3 LV SV:         100 LV SV Index:   53 LVOT Area:     4.52 cm  RIGHT VENTRICLE             IVC RV Basal diam:  3.00 cm     IVC diam: 1.60 cm RV Mid diam:    1.90 cm RV S prime:     11.70 cm/s TAPSE (M-mode): 2.1 cm LEFT ATRIUM             Index        RIGHT ATRIUM           Index LA diam:        4.20 cm 2.22 cm/m  RA Area:     14.80 cm LA Vol (A2C):   51.3 ml 27.14 ml/m RA Volume:   31.50 ml  16.67 ml/m LA Vol (A4C):   57.3 ml 30.32 ml/m LA Biplane Vol: 54.4 ml 28.78 ml/m  AORTIC VALVE LVOT Vmax:   94.00 cm/s LVOT Vmean:  64.200 cm/s LVOT VTI:    0.220 m  AORTA Ao Root diam: 3.70 cm Ao Asc diam:  3.45 cm MITRAL VALVE MV Area (PHT): 4.15 cm    SHUNTS MV Decel Time: 183 msec    Systemic VTI:  0.22 m MV E velocity: 77.10 cm/s  Systemic Diam: 2.40 cm MV A velocity: 48.00 cm/s MV E/A ratio:  1.61 Riley Lam MD Electronically signed by Riley Lam MD Signature Date/Time: 09/29/2020/1:57:30 PM    Final    VAS US CAROTID  Result Date: 09/29/2020 Carotid Arterial Duplex Study Indications:       Syncope. Risk Factors:      Hypertension, hyperlipidemia, past history of smoking. Other Factors:     Patient reports family history of stroke. Comparison Study:  No prior studies. Performing Technologist: Jean Rosenthal RDMS,RVT  Examination Guidelines: A complete evaluation includes B-mode imaging, spectral Doppler, color Doppler, and power  Doppler as needed of all accessible portions of each vessel. Bilateral testing is considered an integral part of a complete examination. Limited examinations for reoccurring indications may be performed as noted.  Right Carotid Findings: +----------+--------+--------+--------+------------------+--------+           PSV cm/sEDV cm/sStenosisPlaque DescriptionComments +----------+--------+--------+--------+------------------+--------+ CCA Prox  72      17                                         +----------+--------+--------+--------+------------------+--------+ CCA Distal60      17              heterogenous               +----------+--------+--------+--------+------------------+--------+ ICA Prox  38      11      1-39%                     tortuous  +----------+--------+--------+--------+------------------+--------+ ICA Distal86      29                                         +----------+--------+--------+--------+------------------+--------+ ECA       49      9                                 tortuous +----------+--------+--------+--------+------------------+--------+ +----------+--------+-------+----------------+-------------------+           PSV cm/sEDV cmsDescribe        Arm Pressure (mmHG) +----------+--------+-------+----------------+-------------------+ [id number]            Multiphasic, WNL                    +----------+--------+-------+----------------+-------------------+ +---------+--------+--+--------+--+---------+ VertebralPSV cm/s33EDV cm/s10Antegrade +---------+--------+--+--------+--+---------+  Left Carotid Findings: +----------+--------+--------+--------+---------------------------+--------+           PSV cm/sEDV cm/sStenosisPlaque Description         Comments +----------+--------+--------+--------+---------------------------+--------+ CCA Prox  107     25              hyperechoic and smooth              +----------+--------+--------+--------+---------------------------+--------+ CCA Distal75      15                                                  +----------+--------+--------+--------+---------------------------+--------+ ICA Prox  90      22      1-39%   hypoechoic and heterogenoustortuous +----------+--------+--------+--------+---------------------------+--------+ ICA Distal86      24                                         tortuous +----------+--------+--------+--------+---------------------------+--------+ ECA       111     17                                         tortuous +----------+--------+--------+--------+---------------------------+--------+ +----------+--------+--------+----------------+-------------------+  PSV cm/sEDV cm/sDescribe         Arm Pressure (mmHG) +----------+--------+--------+----------------+-------------------+ [id number]             Multiphasic, WNL                    +----------+--------+--------+----------------+-------------------+ +---------+--------+--+--------+-+---------+ VertebralPSV cm/s32EDV cm/s7Antegrade +---------+--------+--+--------+-+---------+   Summary: Right Carotid: Velocities in the right ICA are consistent with a 1-39% stenosis.                Non-hemodynamically significant plaque <50% noted in the CCA. Left Carotid: Velocities in the left ICA are consistent with a 1-39% stenosis.               Non-hemodynamically significant plaque <50% noted in the CCA. Vertebrals:  Bilateral vertebral arteries demonstrate antegrade flow. Subclavians: Normal flow hemodynamics were seen in bilateral subclavian              arteries. *See table(s) above for measurements and observations.  Electronically signed by Sherald Hess MD on 09/29/2020 at 11:43:14 AM.    Final     Cardiac Studies   Echo 09/29/20: 1. Left ventricular ejection fraction, by estimation, is 55 to 60%. The  left ventricle has normal function. The left ventricle has no regional  wall motion abnormalities. Left ventricular diastolic parameters were  normal.  2. Right ventricular systolic function is normal. The right ventricular  size is normal.  3. The mitral valve is normal in structure. No evidence of mitral valve  regurgitation.  4. The aortic valve is grossly normal. There is mild calcification of the  aortic valve. There is mild thickening of the aortic valve. Aortic valve regurgitation is not visualized. No aortic stenosis is present.  5. Aortic dilatation noted. There is borderline dilatation of the aortic root, measuring 37 mm.  6. The inferior vena cava is normal in size with greater than 50%  respiratory variability, suggesting right atrial pressure of 3 mmHg.   Patient Profile     75 y.o. male with PMH  significant for daily alcohol use, HTN, HLD, depression, and polymyalgia rheumatica presented after a syncopal episode and found to be in atrial flutter/fibrillation.    Assessment & Plan    Atrial flutter --> atrial fibrillation - converted to sinus rhythm with cardizem - now sinus bradycardia without room to add PO cardizem - currently in sinus bradycardia in the 50s - one option would be to add a pill-in-the-pocket 30 mg cardizem - Dr. Graciela Husbands entertained the idea of ablation - can monitor burden if ILR is in place    Need for chronic anticoagulation - This patients CHA2DS2-VASc Score and unadjusted Ischemic Stroke Rate (% per year) is equal to 3.2 % stroke rate/year from a score of 3 (2age, HTN) - is currently on eliquis for stroke PPX - would need to investigate etiology of syncope and fall - may change anticoagulation recommendations - recommend alcohol cessation   Syncope - orthostatic vitals were negative for hypotension - CTA negative for PE - carotid artery [place] negative for significant stenosis - EEG pending - appreciate neurology input - MRI brain no acute abnormalities - etiology not entirely clear, but pt was in Aflutter RVR on arrival, unaware of rhythm - Dr. Graciela Husbands saw over the weekend and entertained the idea of ILR - I have messaged the office for an appointment first available   Elevated troponin - hs troponin 27 --> 25 - felt to be more consistent with demand ischemia likely from RVR rather  than and ACS process - echo with normal EF and no WMA - pt denies chest pain - will not pursue ischemic etiology at this time   Daily alcohol use - 6-8 shots of bourbon nightly - on CIWA   Hypertension - home regimen included 20 mg lisinopril    For questions or updates, please contact [facility] Please consult [email or web address] for contact info under        Signed, Marcelino Duster, PA  09/30/2020, 10:14 AM    Attending Note:   The patient was seen and  examined.  Agree with assessment and plan as noted above.  Changes made to the above note as needed.  Patient seen and independently examined with Bettina Gavia, PA .   We discussed all aspects of the encounter. I agree with the assessment and plan as stated above.  1.  Atrial flutter ,  Currently in sinus rhythm, is bradycardic.   We are unable to add any HR slowing meds at this point .  Cont eliquis  Syncope- occurred during / after    He is scheduled for an appt with Dr.Klein.  May be able to have the loop recorder implanted at that visit   No additional cardiac eval needed at this point.     I have spent a total of 40 minutes with patient reviewing hospital  notes , telemetry, EKGs, labs and examining patient as well as establishing an assessment and plan that was discussed with the patient. > 50% of time was spent in direct patient care.  [facility] will sign off.   Medication Recommendations:   Cont current meds Other recommendations (labs, testing, etc):   Follow up as an outpatient:  With Dr. Graciela Husbands at [facility]    Vesta Mixer, Montez Hageman., MD, [facility] 09/30/2020, 12:11 PM [address],  [address] Office [phone] Pager [phone]

## 2020-09-30 NOTE — Plan of Care (Signed)
  Problem: Education: Goal: Knowledge of General Education information will improve Description: Including pain rating scale, medication(s)/side effects and non-pharmacologic comfort measures Outcome: Adequate for Discharge   Problem: Health Behavior/Discharge Planning: Goal: Ability to manage health-related needs will improve Outcome: Adequate for Discharge   Problem: Clinical Measurements: Goal: Ability to maintain clinical measurements within normal limits will improve 09/30/2020 1505 by Mariann Laster, RN Outcome: Adequate for Discharge 09/30/2020 1231 by Mariann Laster, RN Outcome: Adequate for Discharge Goal: Diagnostic test results will improve Outcome: Adequate for Discharge Goal: Cardiovascular complication will be avoided 09/30/2020 1505 by Mariann Laster, RN Outcome: Adequate for Discharge 09/30/2020 1231 by Mariann Laster, RN Outcome: Progressing   Problem: Activity: Goal: Risk for activity intolerance will decrease Outcome: Adequate for Discharge   Problem: Pain Managment: Goal: General experience of comfort will improve 09/30/2020 1505 by Mariann Laster, RN Outcome: Adequate for Discharge 09/30/2020 1231 by Mariann Laster, RN Outcome: Adequate for Discharge   Problem: Safety: Goal: Ability to remain free from injury will improve 09/30/2020 1505 by Mariann Laster, RN Outcome: Adequate for Discharge 09/30/2020 1231 by Mariann Laster, RN Outcome: Adequate for Discharge   Problem: Education: Goal: Knowledge of disease or condition will improve Outcome: Adequate for Discharge Goal: Understanding of discharge needs will improve Outcome: Adequate for Discharge   Problem: Health Behavior/Discharge Planning: Goal: Ability to identify changes in lifestyle to reduce recurrence of condition will improve Outcome: Adequate for Discharge Goal: Identification of resources available to assist in meeting health care needs will improve Outcome: Adequate for Discharge    Problem: Physical Regulation: Goal: Complications related to the disease process, condition or treatment will be avoided or minimized Outcome: Adequate for Discharge   Problem: Safety: Goal: Ability to remain free from injury will improve Outcome: Adequate for Discharge   Problem: Education: Goal: Knowledge of disease or condition will improve Outcome: Adequate for Discharge Goal: Understanding of medication regimen will improve Outcome: Adequate for Discharge Goal: Individualized Educational Video(s) Outcome: Adequate for Discharge   Problem: Activity: Goal: Ability to tolerate increased activity will improve Outcome: Adequate for Discharge   Problem: Cardiac: Goal: Ability to achieve and maintain adequate cardiopulmonary perfusion will improve Outcome: Adequate for Discharge   Problem: Health Behavior/Discharge Planning: Goal: Ability to safely manage health-related needs after discharge will improve Outcome: Adequate for Discharge

## 2020-09-30 NOTE — Progress Notes (Signed)
EEG Completed; Results Pending  

## 2020-09-30 NOTE — Plan of Care (Signed)
  Problem: Clinical Measurements: Goal: Cardiovascular complication will be avoided Outcome: Progressing   Problem: Clinical Measurements: Goal: Ability to maintain clinical measurements within normal limits will improve Outcome: Adequate for Discharge   Problem: Pain Managment: Goal: General experience of comfort will improve Outcome: Adequate for Discharge   Problem: Safety: Goal: Ability to remain free from injury will improve Outcome: Adequate for Discharge

## 2020-09-30 NOTE — TOC Progression Note (Incomplete)
Transition of Care [facility]) - Progression Note    Patient Details  Name: Devin Riggs MRN: [id number] Date of Birth: 01-20-1946  [facility] CM/SW Contact  Geni Bers, RN Phone Number: 09/30/2020, 1:46 PM  Clinical Narrative:    Explained to pt to card toll free number on Eliquis free trail card to inquire about.    Expected Discharge Plan: Home/Self Care Barriers to Discharge: No Barriers Identified  Expected Discharge Plan and Services Expected Discharge Plan: Home/Self Care       Living arrangements for the past 2 months: Single Family Home Expected Discharge Date: 09/30/20                                     Social Determinants of Health (SDOH) Interventions    Readmission Risk Interventions No flowsheet data found.

## 2020-09-30 NOTE — Discharge Instructions (Signed)
Syncope Syncope is when you pass out (faint) for a short time. It is caused by a sudden decrease in blood flow to the brain. Signs that you may be about to pass out include:  Feeling dizzy or light-headed.  Feeling sick to your stomach (nauseous).  Seeing all white or all black.  Having cold, clammy skin. If you pass out, get help right away. Call your local emergency services (911 in the [place] Do not drive yourself to the hospital. Follow these instructions at home: Watch for any changes in your symptoms. Take these actions to stay safe and help with your symptoms: Lifestyle  Do not drive, use machinery, or play sports until your doctor says it is okay.  Do not drink alcohol.  Do not use any products that contain nicotine or tobacco, such as cigarettes and e-cigarettes. If you need help quitting, ask your doctor.  Drink enough fluid to keep your pee (urine) pale yellow. General instructions  Take over-the-counter and prescription medicines only as told by your doctor.  If you are taking blood pressure or heart medicine, sit up and stand up slowly. Spend a few minutes getting ready to sit and then stand. This can help you feel less dizzy.  Have someone stay with you until you feel stable.  If you start to feel like you might pass out, lie down right away and raise (elevate) your feet above the level of your heart. Breathe deeply and steadily. Wait until all of the symptoms are gone.  Keep all follow-up visits as told by your doctor. This is important. Get help right away if:  You have a very bad headache.  You pass out once or more than once.  You have pain in your chest, belly, or back.  You have a very fast or uneven heartbeat (palpitations).  It hurts to breathe.  You are bleeding from your mouth or your bottom (rectum).  You have black or tarry poop (stool).  You have jerky movements that you cannot control (seizure).  You are confused.  You have trouble  walking.  You are very weak.  You have vision problems. These symptoms may be an emergency. Do not wait to see if the symptoms will go away. Get medical help right away. Call your local emergency services (911 in the [place] Do not drive yourself to the hospital. Summary  Syncope is when you pass out (faint) for a short time. It is caused by a sudden decrease in blood flow to the brain.  Signs that you may be about to faint include feeling dizzy, light-headed, or sick to your stomach, seeing all white or all black, or having cold, clammy skin.  If you start to feel like you might pass out, lie down right away and raise (elevate) your feet above the level of your heart. Breathe deeply and steadily. Wait until all of the symptoms are gone. This information is not intended to replace advice given to you by your health care provider. Make sure you discuss any questions you have with your health care provider. Document Revised: 07/26/2019 Document Reviewed: 07/28/2017 Elsevier Patient Education  2021 Elsevier Inc.   Atrial Flutter  Atrial flutter is a type of abnormal heart rhythm (arrhythmia). The heart has an electrical system that tells it how to beat. In atrial flutter, the signals move rapidly in the top chambers of the heart (the atria). This makes your heart beat very fast. Atrial flutter can come and go, or it can be  permanent. The goal of treatment is to prevent blood clots from forming, control your heart rate, or restore your heartbeat to a normal rhythm. If this condition is not treated, it can cause serious problems, such as a weakened heart muscle (cardiomyopathy) or a stroke. What are the causes? This condition is often caused by conditions that damage the heart's electrical system. These include:  Heart conditions and heart surgery. These include heart attacks and open-heart surgery.  Lung problems, such as COPD or a blood clot in the lung (pulmonary embolism, or PE).  Poorly  controlled high blood pressure (hypertension).  Overactive thyroid (hyperthyroidism).  Diabetes. In some cases, the cause of this condition is not known. What increases the risk? You are more likely to develop this condition if:  You are an elderly adult.  You are a man.  You are overweight (obese).  You have obstructive sleep apnea.  You have a family history of atrial flutter.  You have diabetes.  You drink a lot of alcohol, especially binge drinking.  You use drugs, including cannabis.  You smoke. What are the signs or symptoms? Symptoms of this condition include:  A feeling that your heart is pounding or racing (palpitations).  Shortness of breath.  Chest pain.  Feeling dizzy or light-headed.  Fainting.  Low blood pressure (hypotension).  Fatigue.  Tiring easily during exercise or activity. In some cases, there are no symptoms. How is this diagnosed? This condition may be diagnosed with:  An electrocardiogram (ECG) to check electrical signals of the heart.  An ambulatory cardiac monitor to record your heart's activity for a few days.  An echocardiogram to create pictures of your heart.  A transesophageal echocardiogram (TEE) to create even better pictures of your heart.  A stress test to check your blood supply while you exercise.  Imaging tests, such as a CT scan or chest X-ray.  Blood tests. How is this treated? Treatment depends on underlying conditions and how you feel when you experience atrial flutter. This condition may be treated with:  Medicines to prevent blood clots or to treat heart rate or heart rhythm problems.  Electrical cardioversion to reset the heart's rhythm.  Ablation to remove the heart tissue that sends abnormal signals.  Left atrial appendage closure to seal the area where blood clots can form. In some cases, underlying conditions will be treated. Follow these instructions at home: Medicines  Take over-the-counter  and prescription medicines only as told by your health care provider.  Do not take any new medicines without talking to your health care provider.  If you are taking blood thinners: ? Talk with your health care provider before you take any medicines that contain aspirin or NSAIDs, such as ibuprofen. These medicines increase your risk for dangerous bleeding. ? Take your medicine exactly as told, at the same time every day. ? Avoid activities that could cause injury or bruising, and follow instructions about how to prevent falls. ? Wear a medical alert bracelet or carry a card that lists what medicines you take. Lifestyle  Eat heart-healthy foods. Talk with a dietitian to make an eating plan that is right for you.  Do not use any products that contain nicotine or tobacco, such as cigarettes, e-cigarettes, and chewing tobacco. If you need help quitting, ask your health care provider.  Do not drink alcohol.  Do not use drugs, including cannabis.  Lose weight if you are overweight or obese.  Exercise regularly as instructed by your health care provider.  General instructions  Do not use diet pills unless your health care provider approves. Diet pills may make heart problems worse.  If you have obstructive sleep apnea, manage your condition as told by your health care provider.  Keep all follow-up visits as told by your health care provider. This is important. Contact a health care provider if you:  Notice a change in the rate, rhythm, or strength of your heartbeat.  Are taking a blood thinner and you notice more bruising.  Have a sudden change in weight.  Tire more easily when you exercise or do heavy work. Get help right away if you have:  Pain or pressure in your chest.  Shortness of breath.  Fainting.  Increasing sweating with no known cause.  Side effects of blood thinners, such as blood in your vomit, stool, or urine, or bleeding that cannot stop.  Any symptoms of a  stroke. "BE FAST" is an easy way to remember the main warning signs of a stroke: ? B - Balance. Signs are dizziness, sudden trouble walking, or loss of balance. ? E - Eyes. Signs are trouble seeing or a sudden change in vision. ? F - Face. Signs are sudden weakness or numbness of the face, or the face or eyelid drooping on one side. ? A - Arms. Signs are weakness or numbness in an arm. This happens suddenly and usually on one side of the body. ? S - Speech. Signs are sudden trouble speaking, slurred speech, or trouble understanding what people say. ? T - Time. Time to call emergency services. Write down what time symptoms started.  Other signs of a stroke, such as: ? A sudden, severe headache with no known cause. ? Nausea or vomiting. ? Seizure.  These symptoms may represent a serious problem that is an emergency. Do not wait to see if the symptoms will go away. Get medical help right away. Call your local emergency services (911 in the [place] Do not drive yourself to the hospital. Summary  Atrial flutter is an abnormal heart rhythm that can give you symptoms of palpitations, shortness of breath, or fatigue.  Atrial flutter is often treated with medicines to keep your heart in a normal rhythm and to prevent a stroke.  Get help right away if you cannot catch your breath, or have chest pain or pressure.  Get help right away if you have signs or symptoms of a stroke. This information is not intended to replace advice given to you by your health care provider. Make sure you discuss any questions you have with your health care provider. Document Revised: 12/07/2018 Document Reviewed: 12/07/2018 Elsevier Patient Education  2021 [employer].   Information on my medicine - ELIQUIS (apixaban)  This medication education was reviewed with me or my healthcare representative as part of my discharge preparation.    Why was Eliquis prescribed for you? Eliquis was prescribed for you to reduce  the risk of a blood clot forming that can cause a stroke if you have a medical condition called atrial fibrillation (a type of irregular heartbeat).  What do You need to know about Eliquis ? Take your Eliquis TWICE DAILY - one tablet in the morning and one tablet in the evening with or without food. If you have difficulty swallowing the tablet whole please discuss with your pharmacist how to take the medication safely.  Take Eliquis exactly as prescribed by your doctor and DO NOT stop taking Eliquis without talking to the doctor who prescribed  the medication.  Stopping may increase your risk of developing a stroke.  Refill your prescription before you run out.  After discharge, you should have regular check-up appointments with your healthcare provider that is prescribing your Eliquis.  In the future your dose may need to be changed if your kidney function or weight changes by a significant amount or as you get older.  What do you do if you miss a dose? If you miss a dose, take it as soon as you remember on the same day and resume taking twice daily.  Do not take more than one dose of ELIQUIS at the same time to make up a missed dose.  Important Safety Information A possible side effect of Eliquis is bleeding. You should call your healthcare provider right away if you experience any of the following: ? Bleeding from an injury or your nose that does not stop. ? Unusual colored urine (red or dark brown) or unusual colored stools (red or black). ? Unusual bruising for unknown reasons. ? A serious fall or if you hit your head (even if there is no bleeding).  Some medicines may interact with Eliquis and might increase your risk of bleeding or clotting while on Eliquis. To help avoid this, consult your healthcare provider or pharmacist prior to using any new prescription or non-prescription medications, including herbals, vitamins, non-steroidal anti-inflammatory drugs (NSAIDs) and  supplements.  This website has more information on Eliquis (apixaban): http://www.eliquis.com/eliquis/home

## 2020-10-03 ENCOUNTER — Telehealth: Payer: Self-pay

## 2020-10-03 NOTE — Telephone Encounter (Signed)
Attempted phone call to pt.  Left voicemail message to contact [place], Dr Odessa Fleming scheduler at [phone] to schedule appointment for hospital follow up.

## 2020-10-07 DIAGNOSIS — I4891 Unspecified atrial fibrillation: Secondary | ICD-10-CM | POA: Diagnosis not present

## 2020-10-07 DIAGNOSIS — I10 Essential (primary) hypertension: Secondary | ICD-10-CM | POA: Diagnosis not present

## 2020-10-09 NOTE — Progress Notes (Signed)
Electrophysiology TeleHealth Note   Due to national recommendations of social distancing due to COVID 19, an audio/video telehealth visit is felt to be most appropriate for this patient at this time.  See MyChart message from today for the patient's consent to telehealth for [facility].   Date:  10/10/2020   ID:  Devin Riggs, DOB Aug 18, 1945, MRN [id number]  Location: patient's home  Provider location: [address] [place]  Evaluation Performed: Follow-up visit  PCP:  Darrow Bussing, MD  Cardiologist:   New/SK Electrophysiologist:  SK   Chief Complaint:  Atrial flutter and syncope  History of Present Illness:    Devin Riggs is a 75 y.o. male who presents via audio/video conferencing for a telehealth visit today.  Previously seen at hospital having persented with syncope and found to have atrial flutter of which he was unaware and which then degenerated into atrial fib w reversion to sinus, occurring in the context of EtOH use-high (>4+ ounces bourbon/day) discharge on Apixoban; neuro eval >>MRI and EEG were neg, and discharge with anticipation for Loop and AFlutter ablation, the patient reports he is doing great  Denies exercise intolerance.  Not driving.  We did not discuss his alcohol intake  DATE TEST EF   4/22 Echo   55-60 %            Thromboembolic risk factors ( age  -2, HTN-1) for a CHADSVASc Score of >=3 The patient denies symptoms of fevers, chills, cough, or new SOB worrisome for COVID 19.    Past Medical History:  Diagnosis Date  . Depression   . HLD (hyperlipidemia)   . HTN (hypertension)   . Polymyalgia rheumatica (HCC)     No past surgical history on file.  Current Outpatient Medications  Medication Sig Dispense Refill  . apixaban (ELIQUIS) 5 MG TABS tablet Take 1 tablet (5 mg total) by mouth 2 (two) times daily. 60 tablet 2  . atorvastatin (LIPITOR) 10 MG tablet Take 10 mg by mouth every morning.    Marland Kitchen FIBER ADULT  GUMMIES PO Take 1 tablet by mouth every morning.    . folic acid (FOLVITE) 1 MG tablet Take 1 tablet (1 mg total) by mouth daily. 30 tablet 0  . Multiple Vitamin (MULTIVITAMIN WITH MINERALS) TABS tablet Take 1 tablet by mouth every morning.    . sertraline (ZOLOFT) 100 MG tablet Take 150 mg by mouth every morning.    . sodium fluoride (PREVIDENT 5000 PLUS) 1.1 % CREA dental cream Place 1 application onto teeth See admin instructions. Brush at bedtime for 2 minutes, spit out, don't eat, drink or rinse for 30 minutes .    . thiamine 100 MG tablet Take 1 tablet (100 mg total) by mouth daily. 30 tablet 2  . vitamin B-12 (CYANOCOBALAMIN) 250 MCG tablet Take 1 tablet (250 mcg total) by mouth daily. 30 tablet 2   No current facility-administered medications for this visit.    Allergies:   Sulfa antibiotics   Social History:  The patient  reports that he has quit smoking. He has never used smokeless tobacco. He reports current alcohol use.   Family History:  The patient's   family history is not on file.   ROS:  Please see the history of present illness.   All other systems are personally reviewed and negative.    Exam:    Vital Signs:  BP 140/61   Pulse 60   Temp (!) 97 F (36.1 C)  Ht 5' 8.5" (1.74 m)   Wt 182 lb (82.6 kg)   BMI 27.27 kg/m      Labs/Other Tests and Data Reviewed:    Recent Labs: 09/29/2020: Magnesium 2.2; TSH 1.870 09/30/2020: ALT 22; BUN 24; Creatinine, Ser 1.20; Hemoglobin 14.2; Platelets 179; Potassium 4.6; Sodium 137   Wt Readings from Last 3 Encounters:  10/10/20 182 lb (82.6 kg)  09/29/20 177 lb 4 oz (80.4 kg)     Other studies personally reviewed:     ASSESSMENT & PLAN:    Atrial flutter-typical  Syncope question mechanism  Alcohol intake-exuberant   Discussed treatment strategies for atrial flutter again; he would like to be proactive and pursue a procedural/curative approach as opposed to taking a pharmacological one.  We have discussed  benefits and risks including but not limited to a less than 06/998 risk of stroke and or dying and have compared this to his thromboembolic risk with a CHA2DS2-VASc score of 3 and the additive risks of anticoagulation.  He has a history of atrial fibrillation that was seen in the wake of his atrial flutter; I have suggested that there is probably about a 50% chance of atrial fibrillation but that we would intend to discontinue his anticoagulation following the procedure as we would anticipate implanting a loop recorder at the time of his procedure both for syncope as well as for atrial fibrillation monitoring  He would like to get this done as soon as possible and our schedules do not overlap for about 6 weeks.  Hence, I have reached out to Dr. Leonia Reeves to see if he has availability to do him in the interim.    COVID 19 screen The patient denies symptoms of COVID 19 at this time.  The importance of social distancing was discussed today.  Follow-up: Following his ablation    Current medicines are reviewed at length with the patient today.   The patient does not have concerns regarding his medicines.  The following changes were made today:  none  Labs/ tests ordered today include: Catheter ablation for atrial flutter hopefully with Dr. Leonia Reeves 4/20 No orders of the defined types were placed in this encounter.       Patient Risk:  after full review of this patients clinical status, I feel that they are at moderate  risk at this time.  Today, I have spent 18 minutes with the patient with telehealth technology discussing the above.  Signed, Sherryl Manges, MD  10/10/2020 3:01 PM     [facility] [address] [place] [address] [phone] (office) [phone] (fax)

## 2020-10-10 ENCOUNTER — Telehealth (INDEPENDENT_AMBULATORY_CARE_PROVIDER_SITE_OTHER): Payer: Medicare Other | Admitting: Internal Medicine

## 2020-10-10 ENCOUNTER — Other Ambulatory Visit: Payer: Self-pay

## 2020-10-10 ENCOUNTER — Telehealth: Payer: Self-pay

## 2020-10-10 VITALS — BP 140/61 | HR 60 | Temp 97.0°F | Ht 68.5 in | Wt 182.0 lb

## 2020-10-10 DIAGNOSIS — R55 Syncope and collapse: Secondary | ICD-10-CM | POA: Diagnosis not present

## 2020-10-10 DIAGNOSIS — I4892 Unspecified atrial flutter: Secondary | ICD-10-CM | POA: Diagnosis not present

## 2020-10-10 NOTE — Patient Instructions (Signed)
Medication Instructions:  Your physician recommends that you continue on your current medications as directed. Please refer to the Current Medication list given to you today.  *If you need a refill on your cardiac medications before your next appointment, please call your pharmacy*   Lab Work: None ordered.  If you have labs (blood work) drawn today and your tests are completely normal, you will receive your results only by: Marland Kitchen MyChart Message (if you have MyChart) OR . A paper copy in the mail If you have any lab test that is abnormal or we need to change your treatment, we will call you to review the results.   Testing/Procedures: A-Flutter Ablation 10/16/2020 with Dr Lewayne Bunting Your physician has recommended that you have an ablation. Catheter ablation is a medical procedure used to treat some cardiac arrhythmias (irregular heartbeats). During catheter ablation, a long, thin, flexible tube is put into a blood vessel in your groin (upper thigh), or neck. This tube is called an ablation catheter. It is then guided to your heart through the blood vessel. Radio frequency waves destroy small areas of heart tissue where abnormal heartbeats may cause an arrhythmia to start. Please see the instruction sheet given to you today.    Follow-Up: At [facility], you and your health needs are our priority.  As part of our continuing mission to provide you with exceptional heart care, we have created designated Provider Care Teams.  These Care Teams include your primary Cardiologist (physician) and Advanced Practice Providers (APPs -  Physician Assistants and Nurse Practitioners) who all work together to provide you with the care you need, when you need it.  We recommend signing up for the patient portal called "MyChart".  Sign up information is provided on this After Visit Summary.  MyChart is used to connect with patients for Virtual Visits (Telemedicine).  Patients are able to view lab/test results,  encounter notes, upcoming appointments, etc.  Non-urgent messages can be sent to your provider as well.   To learn more about what you can do with MyChart, go to [email or web address].    Your next appointment:   To be scheduled   Other Instructions You will be contacted with specific instructions and times for A-Flutter ablation.

## 2020-10-10 NOTE — Telephone Encounter (Signed)
  Patient Consent for Virtual Visit         Devin Riggs has provided verbal consent on 10/10/2020 for a virtual visit (video or telephone).   CONSENT FOR VIRTUAL VISIT FOR:  Devin Riggs  By participating in this virtual visit I agree to the following:  I hereby voluntarily request, consent and authorize [facility] and its employed or contracted physicians, Producer, television/film/video, nurse practitioners or other licensed health care professionals (the Practitioner), to provide me with telemedicine health care services (the "Services") as deemed necessary by the treating Practitioner. I acknowledge and consent to receive the Services by the Practitioner via telemedicine. I understand that the telemedicine visit will involve communicating with the Practitioner through live audiovisual communication technology and the disclosure of certain medical information by electronic transmission. I acknowledge that I have been given the opportunity to request an in-person assessment or other available alternative prior to the telemedicine visit and am voluntarily participating in the telemedicine visit.  I understand that I have the right to withhold or withdraw my consent to the use of telemedicine in the course of my care at any time, without affecting my right to future care or treatment, and that the Practitioner or I may terminate the telemedicine visit at any time. I understand that I have the right to inspect all information obtained and/or recorded in the course of the telemedicine visit and may receive copies of available information for a reasonable fee.  I understand that some of the potential risks of receiving the Services via telemedicine include:  Marland Kitchen Delay or interruption in medical evaluation due to technological equipment failure or disruption; . Information transmitted may not be sufficient (e.g. poor resolution of images) to allow for appropriate medical decision making by the  Practitioner; and/or  . In rare instances, security protocols could fail, causing a breach of personal health information.  Furthermore, I acknowledge that it is my responsibility to provide information about my medical history, conditions and care that is complete and accurate to the best of my ability. I acknowledge that Practitioner's advice, recommendations, and/or decision may be based on factors not within their control, such as incomplete or inaccurate data provided by me or distortions of diagnostic images or specimens that may result from electronic transmissions. I understand that the practice of medicine is not an exact science and that Practitioner makes no warranties or guarantees regarding treatment outcomes. I acknowledge that a copy of this consent can be made available to me via my patient portal [facility] MyChart), or I can request a printed copy by calling the office of [facility].    I understand that my insurance will be billed for this visit.   I have read or had this consent read to me. . I understand the contents of this consent, which adequately explains the benefits and risks of the Services being provided via telemedicine.  . I have been provided ample opportunity to ask questions regarding this consent and the Services and have had my questions answered to my satisfaction. . I give my informed consent for the services to be provided through the use of telemedicine in my medical care

## 2020-10-11 NOTE — Telephone Encounter (Signed)
Spoke with pt who would like to schedule appointment with Dr Ladona Ridgel to meet and speak with him prior to scheduling ablation or Loop recorder insertion.  Appointment scheduled for 10/18/2020 at 2:15pm.  Pt verbalizes understanding and thanked Charity fundraiser for the call.

## 2020-10-18 ENCOUNTER — Encounter: Payer: Self-pay | Admitting: Internal Medicine

## 2020-10-18 ENCOUNTER — Other Ambulatory Visit: Payer: Self-pay

## 2020-10-18 ENCOUNTER — Ambulatory Visit (INDEPENDENT_AMBULATORY_CARE_PROVIDER_SITE_OTHER): Payer: Medicare Other | Admitting: Internal Medicine

## 2020-10-18 VITALS — BP 148/78 | HR 61 | Ht 68.5 in | Wt 182.0 lb

## 2020-10-18 DIAGNOSIS — I4892 Unspecified atrial flutter: Secondary | ICD-10-CM

## 2020-10-18 DIAGNOSIS — R55 Syncope and collapse: Secondary | ICD-10-CM | POA: Diagnosis not present

## 2020-10-18 NOTE — Progress Notes (Signed)
HPI Mr. Rogus is referred by Dr. Graciela Husbands to discuss catheter ablation of atrial flutter. He is a pleasant 75 yo man who presented with syncope and was found to be in atrial flutter earlier this month. He reverted back to NSR. He did not feel palpitations and did not know that he was out of rhythm. He has not had any pauses. He has been placed on systemic anti-coagulation and encouraged not to drive. He denies chest pain or sob. His 2D echo is normal. He is very frustrated by his being told not to drive for another 5 months.  Allergies  Allergen Reactions  . Sulfa Antibiotics Nausea And Vomiting     Current Outpatient Medications  Medication Sig Dispense Refill  . apixaban (ELIQUIS) 5 MG TABS tablet Take 1 tablet (5 mg total) by mouth 2 (two) times daily. 60 tablet 2  . atorvastatin (LIPITOR) 10 MG tablet Take 10 mg by mouth every morning.    Marland Kitchen FIBER ADULT GUMMIES PO Take 1 tablet by mouth every morning.    . folic acid (FOLVITE) 1 MG tablet Take 1 tablet (1 mg total) by mouth daily. 30 tablet 0  . Multiple Vitamin (MULTIVITAMIN WITH MINERALS) TABS tablet Take 1 tablet by mouth every morning.    . sertraline (ZOLOFT) 100 MG tablet Take 150 mg by mouth every morning.    . sodium fluoride (PREVIDENT 5000 PLUS) 1.1 % CREA dental cream Place 1 application onto teeth See admin instructions. Brush at bedtime for 2 minutes, spit out, don't eat, drink or rinse for 30 minutes .    . thiamine 100 MG tablet Take 1 tablet (100 mg total) by mouth daily. 30 tablet 2  . vitamin B-12 (CYANOCOBALAMIN) 250 MCG tablet Take 1 tablet (250 mcg total) by mouth daily. 30 tablet 2   No current facility-administered medications for this visit.     Past Medical History:  Diagnosis Date  . Depression   . HLD (hyperlipidemia)   . HTN (hypertension)   . Polymyalgia rheumatica (HCC)     ROS:   All systems reviewed and negative except as noted in the HPI.   No past surgical history on file.   No  family history on file.   Social History   Socioeconomic History  . Marital status: Married    Spouse name: Not on file  . Number of children: Not on file  . Years of education: Not on file  . Highest education level: Not on file  Occupational History  . Not on file  Tobacco Use  . Smoking status: Former Games developer  . Smokeless tobacco: Never Used  Vaping Use  . Vaping Use: Never used  Substance and Sexual Activity  . Alcohol use: Yes    Comment: few bourbons every night  . Drug use: Not on file  . Sexual activity: Not on file  Other Topics Concern  . Not on file  Social History Narrative  . Not on file   Social Determinants of Health   Financial Resource Strain: Not on file  Food Insecurity: Not on file  Transportation Needs: Not on file  Physical Activity: Not on file  Stress: Not on file  Social Connections: Not on file  Intimate Partner Violence: Not on file     BP (!) 148/78 (BP Location: Left Arm, Patient Position: Sitting, Cuff Size: Normal)   Pulse 61   Ht 5' 8.5" (1.74 m)   Wt 182 lb (82.6 kg)  SpO2 97%   BMI 27.27 kg/m   Physical Exam:  Well appearing 75 yo man, NAD HEENT: Unremarkable Neck:  No JVD, no thyromegally Lymphatics:  No adenopathy Back:  No CVA tenderness Lungs:  Clear with no wheezes HEART:  Regular rate rhythm, no murmurs, no rubs, no clicks Abd:  soft, positive bowel sounds, no organomegally, no rebound, no guarding Ext:  2 plus pulses, no edema, no cyanosis, no clubbing Skin:  No rashes no nodules Neuro:  CN II through XII intact, motor grossly intact  EKG - reviewed. Typical atrial flutter  Assess/Plan: 1. Atrial flutter - I have discussed the treatment options with the patient and he would like to proceed with catheter ablation. This will be scheduled in the coming weeks. 2. Syncope - I have recommended he undergo insertion of an ILR for unexplained syncope.  3. Exuberant ETOH intake - the patient will ned to reduce his ETOH  intake to reduce the risk of other atrial arrhythmias.  Sharlot Gowda Tynisa Vohs,MD

## 2020-10-18 NOTE — H&P (View-Only) (Signed)
    HPI Mr. Delahoussaye is referred by Dr. Klein to discuss catheter ablation of atrial flutter. He is a pleasant 75 yo man who presented with syncope and was found to be in atrial flutter earlier this month. He reverted back to NSR. He did not feel palpitations and did not know that he was out of rhythm. He has not had any pauses. He has been placed on systemic anti-coagulation and encouraged not to drive. He denies chest pain or sob. His 2D echo is normal. He is very frustrated by his being told not to drive for another 5 months.  Allergies  Allergen Reactions  . Sulfa Antibiotics Nausea And Vomiting     Current Outpatient Medications  Medication Sig Dispense Refill  . apixaban (ELIQUIS) 5 MG TABS tablet Take 1 tablet (5 mg total) by mouth 2 (two) times daily. 60 tablet 2  . atorvastatin (LIPITOR) 10 MG tablet Take 10 mg by mouth every morning.    . FIBER ADULT GUMMIES PO Take 1 tablet by mouth every morning.    . folic acid (FOLVITE) 1 MG tablet Take 1 tablet (1 mg total) by mouth daily. 30 tablet 0  . Multiple Vitamin (MULTIVITAMIN WITH MINERALS) TABS tablet Take 1 tablet by mouth every morning.    . sertraline (ZOLOFT) 100 MG tablet Take 150 mg by mouth every morning.    . sodium fluoride (PREVIDENT 5000 PLUS) 1.1 % CREA dental cream Place 1 application onto teeth See admin instructions. Brush at bedtime for 2 minutes, spit out, don't eat, drink or rinse for 30 minutes .    . thiamine 100 MG tablet Take 1 tablet (100 mg total) by mouth daily. 30 tablet 2  . vitamin B-12 (CYANOCOBALAMIN) 250 MCG tablet Take 1 tablet (250 mcg total) by mouth daily. 30 tablet 2   No current facility-administered medications for this visit.     Past Medical History:  Diagnosis Date  . Depression   . HLD (hyperlipidemia)   . HTN (hypertension)   . Polymyalgia rheumatica (HCC)     ROS:   All systems reviewed and negative except as noted in the HPI.   No past surgical history on file.   No  family history on file.   Social History   Socioeconomic History  . Marital status: Married    Spouse name: Not on file  . Number of children: Not on file  . Years of education: Not on file  . Highest education level: Not on file  Occupational History  . Not on file  Tobacco Use  . Smoking status: Former Smoker  . Smokeless tobacco: Never Used  Vaping Use  . Vaping Use: Never used  Substance and Sexual Activity  . Alcohol use: Yes    Comment: few bourbons every night  . Drug use: Not on file  . Sexual activity: Not on file  Other Topics Concern  . Not on file  Social History Narrative  . Not on file   Social Determinants of Health   Financial Resource Strain: Not on file  Food Insecurity: Not on file  Transportation Needs: Not on file  Physical Activity: Not on file  Stress: Not on file  Social Connections: Not on file  Intimate Partner Violence: Not on file     BP (!) 148/78 (BP Location: Left Arm, Patient Position: Sitting, Cuff Size: Normal)   Pulse 61   Ht 5' 8.5" (1.74 m)   Wt 182 lb (82.6 kg)     SpO2 97%   BMI 27.27 kg/m   Physical Exam:  Well appearing 75 yo man, NAD HEENT: Unremarkable Neck:  No JVD, no thyromegally Lymphatics:  No adenopathy Back:  No CVA tenderness Lungs:  Clear with no wheezes HEART:  Regular rate rhythm, no murmurs, no rubs, no clicks Abd:  soft, positive bowel sounds, no organomegally, no rebound, no guarding Ext:  2 plus pulses, no edema, no cyanosis, no clubbing Skin:  No rashes no nodules Neuro:  CN II through XII intact, motor grossly intact  EKG - reviewed. Typical atrial flutter  Assess/Plan: 1. Atrial flutter - I have discussed the treatment options with the patient and he would like to proceed with catheter ablation. This will be scheduled in the coming weeks. 2. Syncope - I have recommended he undergo insertion of an ILR for unexplained syncope.  3. Exuberant ETOH intake - the patient will ned to reduce his ETOH  intake to reduce the risk of other atrial arrhythmias.  Sharlot Gowda Hillis Mcphatter,MD

## 2020-10-18 NOTE — Patient Instructions (Addendum)
Medication Instructions:  Your physician recommends that you continue on your current medications as directed. Please refer to the Current Medication list given to you today.  Labwork: None ordered.  Testing/Procedures: None ordered.  Follow-Up:  SEE INSTRUCTION LETTER  Any Other Special Instructions Will Be Listed Below (If Applicable).  If you need a refill on your cardiac medications before your next appointment, please call your pharmacy.     Cardiac electrophysiology: From cell to bedside (7th ed., pp. 1239-1252). [place], [place]: Elsevier.">  Cardiac Ablation Cardiac ablation is a procedure to destroy, or ablate, a small amount of heart tissue in very specific places. The heart has many electrical connections. Sometimes these connections are abnormal and can cause the heart to beat very fast or irregularly. Ablating some of the areas that cause problems can improve the heart's rhythm or return it to normal. Ablation may be done for people who:  Have Wolff-Parkinson-White syndrome.  Have fast heart rhythms (tachycardia).  Have taken medicines for an abnormal heart rhythm (arrhythmia) that were not effective or caused side effects.  Have a high-risk heartbeat that may be life-threatening. During the procedure, a small incision is made in the neck or the groin, and a long, thin tube (catheter) is inserted into the incision and moved to the heart. Small devices (electrodes) on the tip of the catheter will send out electrical currents. A type of X-ray (fluoroscopy) will be used to help guide the catheter and to provide images of the heart. Tell a health care provider about:  Any allergies you have.  All medicines you are taking, including vitamins, herbs, eye drops, creams, and over-the-counter medicines.  Any problems you or family members have had with anesthetic medicines.  Any blood disorders you have.  Any surgeries you have had.  Any medical conditions you have,  such as kidney failure.  Whether you are pregnant or may be pregnant. What are the risks? Generally, this is a safe procedure. However, problems may occur, including:  Infection.  Bruising and bleeding at the catheter insertion site.  Bleeding into the chest, especially into the sac that surrounds the heart. This is a serious complication.  Stroke or blood clots.  Damage to nearby structures or organs.  Allergic reaction to medicines or dyes.  Need for a permanent pacemaker if the normal electrical system is damaged. A pacemaker is a small computer that sends electrical signals to the heart and helps your heart beat normally.  The procedure not being fully effective. This may not be recognized until months later. Repeat ablation procedures are sometimes done. What happens before the procedure? Medicines Ask your health care provider about:  Changing or stopping your regular medicines. This is especially important if you are taking diabetes medicines or blood thinners.  Taking medicines such as aspirin and ibuprofen. These medicines can thin your blood. Do not take these medicines unless your health care provider tells you to take them.  Taking over-the-counter medicines, vitamins, herbs, and supplements. General instructions  Follow instructions from your health care provider about eating or drinking restrictions.  Plan to have someone take you home from the hospital or clinic.  If you will be going home right after the procedure, plan to have someone with you for 24 hours.  Ask your health care provider what steps will be taken to prevent infection. What happens during the procedure?  An IV will be inserted into one of your veins.  You will be given a medicine to help you relax (  sedative).  The skin on your neck or groin will be numbed.  An incision will be made in your neck or your groin.  A needle will be inserted through the incision and into a large vein in your  neck or groin.  A catheter will be inserted into the needle and moved to your heart.  Dye may be injected through the catheter to help your surgeon see the area of the heart that needs treatment.  Electrical currents will be sent from the catheter to ablate heart tissue in desired areas. There are three types of energy that may be used to do this: ? Heat (radiofrequency energy). ? Laser energy. ? Extreme cold (cryoablation).  When the tissue has been ablated, the catheter will be removed.  Pressure will be held on the insertion area to prevent a lot of bleeding.  A bandage (dressing) will be placed over the insertion area. The exact procedure may vary among health care providers and hospitals.   What happens after the procedure?  Your blood pressure, heart rate, breathing rate, and blood oxygen level will be monitored until you leave the hospital or clinic.  Your insertion area will be monitored for bleeding. You will need to lie still for a few hours to ensure that you do not bleed from the insertion area.  Do not drive for 24 hours or as long as told by your health care provider. Summary  Cardiac ablation is a procedure to destroy, or ablate, a small amount of heart tissue using an electrical current. This procedure can improve the heart rhythm or return it to normal.  Tell your health care provider about any medical conditions you may have and all medicines you are taking to treat them.  This is a safe procedure, but problems may occur. Problems may include infection, bruising, damage to nearby organs or structures, or allergic reactions to medicines.  Follow your health care provider's instructions about eating and drinking before the procedure. You may also be told to change or stop some of your medicines.  After the procedure, do not drive for 24 hours or as long as told by your health care provider. This information is not intended to replace advice given to you by your  health care provider. Make sure you discuss any questions you have with your health care provider. Document Revised: 04/24/2019 Document Reviewed: 04/24/2019 Elsevier Patient Education  2021 Elsevier Inc.      

## 2020-10-22 ENCOUNTER — Telehealth: Payer: Self-pay | Admitting: Internal Medicine

## 2020-10-22 NOTE — Telephone Encounter (Signed)
New Message:     Wife would like a call please, she have questions about pt's Ablation on Monday,

## 2020-10-22 NOTE — Telephone Encounter (Signed)
Returned call to Pt's wife.  Pt had previously given this nurse permission to speak with wife.  Wife with questions about aflutter ablation as primary therapy.  Reviewed consult notes from Pt hospitalization.  Will send via myChart so she can show her cardiac nurse daughter.  Wife thanked nurse for call back.

## 2020-10-23 DIAGNOSIS — Z23 Encounter for immunization: Secondary | ICD-10-CM | POA: Diagnosis not present

## 2020-10-24 ENCOUNTER — Telehealth: Payer: Self-pay | Admitting: Internal Medicine

## 2020-10-24 ENCOUNTER — Other Ambulatory Visit (HOSPITAL_COMMUNITY)
Admission: RE | Admit: 2020-10-24 | Discharge: 2020-10-24 | Disposition: A | Discharge status: Home / Self Care | Payer: Medicare Other | Source: Ambulatory Visit | Attending: Internal Medicine | Admitting: Internal Medicine

## 2020-10-24 DIAGNOSIS — Z01812 Encounter for preprocedural laboratory examination: Secondary | ICD-10-CM | POA: Diagnosis not present

## 2020-10-24 DIAGNOSIS — Z20822 Contact with and (suspected) exposure to covid-19: Secondary | ICD-10-CM | POA: Diagnosis not present

## 2020-10-24 LAB — SARS CORONAVIRUS 2 (TAT 6-24 HRS): SARS Coronavirus 2: NEGATIVE

## 2020-10-24 NOTE — Telephone Encounter (Signed)
Patient's wife has some standard questions regarding her husbands ablation on 10/28/20.

## 2020-10-24 NOTE — Telephone Encounter (Signed)
Spoke with pt who gives verbal permission for RN to speak with his wife,Judy.  Pt's wife asking about where to park for registering pt for his ablation this coming Monday at [facility].  Pt's wife advised of [place] parking that starts at 5:30am.  Advised that she should be able to sit in the waiting area but as of right now will not be allowed to go back to the holding area.  Stressed importance of pt being quarantined at home until the morning of his procedure, being NPO after midnight the night prior to ablation and not missing any doses of his Eliquis.  Pt's wife verbalizes understanding and thanked Charity fundraiser for the call.

## 2020-10-28 ENCOUNTER — Ambulatory Visit (HOSPITAL_COMMUNITY)
Admission: RE | Admit: 2020-10-28 | Discharge: 2020-10-28 | Disposition: A | Discharge status: Home / Self Care | Payer: Medicare Other | Attending: Internal Medicine | Admitting: Internal Medicine

## 2020-10-28 ENCOUNTER — Ambulatory Visit (HOSPITAL_COMMUNITY)
Admission: RE | Disposition: A | Discharge status: Home / Self Care | Payer: Self-pay | Source: Home / Self Care | Attending: Internal Medicine

## 2020-10-28 ENCOUNTER — Other Ambulatory Visit: Payer: Self-pay

## 2020-10-28 DIAGNOSIS — Z7901 Long term (current) use of anticoagulants: Secondary | ICD-10-CM | POA: Insufficient documentation

## 2020-10-28 DIAGNOSIS — Z882 Allergy status to sulfonamides status: Secondary | ICD-10-CM | POA: Diagnosis not present

## 2020-10-28 DIAGNOSIS — Z7289 Other problems related to lifestyle: Secondary | ICD-10-CM | POA: Diagnosis not present

## 2020-10-28 DIAGNOSIS — R55 Syncope and collapse: Secondary | ICD-10-CM | POA: Insufficient documentation

## 2020-10-28 DIAGNOSIS — Z79899 Other long term (current) drug therapy: Secondary | ICD-10-CM | POA: Insufficient documentation

## 2020-10-28 DIAGNOSIS — Z87891 Personal history of nicotine dependence: Secondary | ICD-10-CM | POA: Diagnosis not present

## 2020-10-28 DIAGNOSIS — I483 Typical atrial flutter: Secondary | ICD-10-CM | POA: Insufficient documentation

## 2020-10-28 HISTORY — PX: LOOP RECORDER INSERTION: EP1214

## 2020-10-28 HISTORY — PX: A-FLUTTER ABLATION: EP1230

## 2020-10-28 SURGERY — A-FLUTTER ABLATION
Anesthesia: LOCAL

## 2020-10-28 MED ORDER — MIDAZOLAM HCL 5 MG/5ML IJ SOLN
INTRAMUSCULAR | Status: AC
  Filled 2020-10-28: qty 5

## 2020-10-28 MED ORDER — LIDOCAINE HCL (PF) 1 % IJ SOLN
INTRAMUSCULAR | Status: DC | PRN
  Administered 2020-10-28: 20 mL

## 2020-10-28 MED ORDER — BUPIVACAINE HCL (PF) 0.25 % IJ SOLN
INTRAMUSCULAR | Status: DC | PRN
  Administered 2020-10-28: 30 mL

## 2020-10-28 MED ORDER — ONDANSETRON HCL 4 MG/2ML IJ SOLN: 4.0000 mg | Freq: Four times a day (QID) | INTRAMUSCULAR | Status: DC | PRN

## 2020-10-28 MED ORDER — HEPARIN (PORCINE) IN NACL 1000-0.9 UT/500ML-% IV SOLN
INTRAVENOUS | Status: AC
  Filled 2020-10-28: qty 500

## 2020-10-28 MED ORDER — SODIUM CHLORIDE 0.9 % IV SOLN: 250.0000 mL | INTRAVENOUS | Status: DC | PRN

## 2020-10-28 MED ORDER — SODIUM CHLORIDE 0.9% FLUSH: 3.0000 mL | INTRAVENOUS | Status: DC | PRN

## 2020-10-28 MED ORDER — MIDAZOLAM HCL 5 MG/5ML IJ SOLN
INTRAMUSCULAR | Status: DC | PRN
  Administered 2020-10-28: 2 mg via INTRAVENOUS
  Administered 2020-10-28: 1 mg via INTRAVENOUS
  Administered 2020-10-28 (×3): 2 mg via INTRAVENOUS

## 2020-10-28 MED ORDER — LIDOCAINE HCL 1 % IJ SOLN
INTRAMUSCULAR | Status: AC
  Filled 2020-10-28: qty 20

## 2020-10-28 MED ORDER — FENTANYL CITRATE (PF) 100 MCG/2ML IJ SOLN
INTRAMUSCULAR | Status: AC
  Filled 2020-10-28: qty 2

## 2020-10-28 MED ORDER — BUPIVACAINE HCL (PF) 0.25 % IJ SOLN
INTRAMUSCULAR | Status: AC
  Filled 2020-10-28: qty 60

## 2020-10-28 MED ORDER — SODIUM CHLORIDE 0.9% FLUSH: 3.0000 mL | Freq: Two times a day (BID) | INTRAVENOUS | Status: DC

## 2020-10-28 MED ORDER — ACETAMINOPHEN 325 MG PO TABS: 650.0000 mg | ORAL_TABLET | ORAL | Status: DC | PRN

## 2020-10-28 MED ORDER — HEPARIN (PORCINE) IN NACL 1000-0.9 UT/500ML-% IV SOLN
INTRAVENOUS | Status: DC | PRN
  Administered 2020-10-28: 500 mL

## 2020-10-28 MED ORDER — HEPARIN SODIUM (PORCINE) 1000 UNIT/ML IJ SOLN
INTRAMUSCULAR | Status: DC | PRN
  Administered 2020-10-28 (×2): 1000 [IU] via INTRAVENOUS

## 2020-10-28 MED ORDER — HEPARIN SODIUM (PORCINE) 1000 UNIT/ML IJ SOLN
INTRAMUSCULAR | Status: AC
  Filled 2020-10-28: qty 1

## 2020-10-28 MED ORDER — FENTANYL CITRATE (PF) 100 MCG/2ML IJ SOLN
INTRAMUSCULAR | Status: DC | PRN
  Administered 2020-10-28: 12.5 ug via INTRAVENOUS
  Administered 2020-10-28 (×2): 25 ug via INTRAVENOUS
  Administered 2020-10-28: 12.5 ug via INTRAVENOUS
  Administered 2020-10-28 (×3): 25 ug via INTRAVENOUS
  Administered 2020-10-28: 12.5 ug via INTRAVENOUS
  Administered 2020-10-28: 25 ug via INTRAVENOUS
  Administered 2020-10-28: 12.5 ug via INTRAVENOUS
  Administered 2020-10-28: 25 ug via INTRAVENOUS
  Administered 2020-10-28: 12.5 ug via INTRAVENOUS
  Administered 2020-10-28: 25 ug via INTRAVENOUS

## 2020-10-28 MED ORDER — SODIUM CHLORIDE 0.9 % IV SOLN: INTRAVENOUS | Status: DC

## 2020-10-28 SURGICAL SUPPLY — 16 items
CATH DUODECA HALO/ISMUS 7FR (CATHETERS) ×2 IMPLANT
CATH JOSEPH QUAD ALLRED 6F REP (CATHETERS) ×2 IMPLANT
CATH SMTCH THERMOCOOL SF FJ (CATHETERS) ×2 IMPLANT
CATH WEBSTER BI DIR CS D-F CRV (CATHETERS) ×2 IMPLANT
MONITOR REVEAL LINQ II (Prosthesis & Implant Heart) ×2 IMPLANT
PACK EP LATEX FREE (CUSTOM PROCEDURE TRAY) ×1
PACK EP LF (CUSTOM PROCEDURE TRAY) ×1 IMPLANT
PACK LOOP INSERTION (CUSTOM PROCEDURE TRAY) ×2 IMPLANT
PAD PRO RADIOLUCENT 2001M-C (PAD) ×2 IMPLANT
PATCH CARTO3 (PAD) ×2 IMPLANT
SHEATH PINNACLE 6F 10CM (SHEATH) ×2 IMPLANT
SHEATH PINNACLE 7F 10CM (SHEATH) ×2 IMPLANT
SHEATH PINNACLE 8F 10CM (SHEATH) ×4 IMPLANT
SHEATH PINNACLE 9F 10CM (SHEATH) ×2 IMPLANT
SHEATH SWARTZ RAMP 8.5F 60CM (SHEATH) ×2 IMPLANT
TUBING SMART ABLATE COOLFLOW (TUBING) ×2 IMPLANT

## 2020-10-28 NOTE — Interval H&P Note (Signed)
History and Physical Interval Note:  10/28/2020 7:39 AM  Devin Riggs  has presented today for surgery, with the diagnosis of aflutter.  The various methods of treatment have been discussed with the patient and family. After consideration of risks, benefits and other options for treatment, the patient has consented to  Procedure(s): A-FLUTTER ABLATION (N/A) LOOP RECORDER INSERTION (N/A) as a surgical intervention.  The patient's history has been reviewed, patient examined, no change in status, stable for surgery.  I have reviewed the patient's chart and labs.  Questions were answered to the patient's satisfaction.     Lewayne Bunting

## 2020-10-28 NOTE — Telephone Encounter (Signed)
Devin Riggs is calling stating she either has food poisoning or the stomach flu and has for the last 26 hrs. She has a sore stomach, vomiting, and liquid coming from her rectum. She also had a fever, but that has broken. She is concerned with [name] coming home with her in this state. She states she does not have anyone that could come monitor him due to her daughter having it as well. She isolated herself from [place] prior to his procedure to insure he did not get it and had her son in law drop him off for the procedure. She is wanting to know the recommendations with how to go about this when [name] comes homes.  Please advise.

## 2020-10-28 NOTE — Discharge Instructions (Signed)
Cardiac Ablation, Care After RESTART ELIQUIS TOMORROW NIGHT 5/3, ONE DOSE  This sheet gives you information about how to care for yourself after your procedure. Your health care provider may also give you more specific instructions. If you have problems or questions, contact your health care provider. What can I expect after the procedure? After the procedure, it is common to have:  Bruising around your puncture site.  Tenderness around your puncture site.  Skipped heartbeats.  Tiredness (fatigue).  Follow these instructions at home: Puncture site care   Follow instructions from your health care provider about how to take care of your puncture site. Make sure you: ? If present, leave stitches (sutures), skin glue, or adhesive strips in place. These skin closures may need to stay in place for up to 2 weeks. If adhesive strip edges start to loosen and curl up, you may trim the loose edges. Do not remove adhesive strips completely unless your health care provider tells you to do that. ? If a large square bandage is present, this may be removed 24 hours after surgery.   Check your puncture site every day for signs of infection. Check for: ? Redness, swelling, or pain. ? Fluid or blood. If your puncture site starts to bleed, lie down on your back, apply firm pressure to the area, and contact your health care provider. ? Warmth. ? Pus or a bad smell. Driving  Do not drive for at least 4 days after your procedure or however long your health care provider recommends. (Do not resume driving if you have previously been instructed not to drive for other health reasons.)  Do not drive or use heavy machinery while taking prescription pain medicine. Activity  Avoid activities that take a lot of effort for at least 7 days after your procedure.  Do not lift anything that is heavier than 5 lb (4.5 kg) for one week.   No sexual activity for 1 week.   Return to your normal activities as told by  your health care provider. Ask your health care provider what activities are safe for you. General instructions  Take over-the-counter and prescription medicines only as told by your health care provider.  Do not use any products that contain nicotine or tobacco, such as cigarettes and e-cigarettes. If you need help quitting, ask your health care provider.  You may shower after 24 hours, but Do not take baths, swim, or use a hot tub for 1 week.   Do not drink alcohol for 24 hours after your procedure.  Keep all follow-up visits as told by your health care provider. This is important. Contact a health care provider if:  You have redness, mild swelling, or pain around your puncture site.  You have fluid or blood coming from your puncture site that stops after applying firm pressure to the area.  Your puncture site feels warm to the touch.  You have pus or a bad smell coming from your puncture site.  You have a fever.  You have chest pain or discomfort that spreads to your neck, jaw, or arm.  You are sweating a lot.  You feel nauseous.  You have a fast or irregular heartbeat.  You have shortness of breath.  You are dizzy or light-headed and feel the need to lie down.  You have pain or numbness in the arm or leg closest to your puncture site. Get help right away if:  Your puncture site suddenly swells.  Your puncture site is bleeding  and the bleeding does not stop after applying firm pressure to the area. These symptoms may represent a serious problem that is an emergency. Do not wait to see if the symptoms will go away. Get medical help right away. Call your local emergency services (911 in the [place] Do not drive yourself to the hospital. Summary  After the procedure, it is normal to have bruising and tenderness at the puncture site in your groin, neck, or forearm.  Check your puncture site every day for signs of infection.  Get help right away if your puncture site is  bleeding and the bleeding does not stop after applying firm pressure to the area. This is a medical emergency. This information is not intended to replace advice given to you by your health care provider. Make sure you discuss any questions you have with your health care provider.  SEE EXTRA PAPER/ LOOP RECORDER DC PAPER

## 2020-10-28 NOTE — Progress Notes (Signed)
Up and walked and tolerated well; right groin stable

## 2020-10-28 NOTE — Progress Notes (Signed)
SITE AREA: right groin/femoral  SITE PRIOR TO REMOVAL:  LEVEL 0  PRESSURE APPLIED FOR: approximately 15 minutes  MANUAL: yes  PATIENT STATUS DURING PULL: wnl  POST PULL SITE:  LEVEL 0  POST PULL INSTRUCTIONS GIVEN: yes  POST PULL PULSES PRESENT: bilateral pedal pulses at +2  DRESSING APPLIED: gauze with tegaderm  BEDREST BEGINS @ 1152  COMMENTS: veinous sheath x3 removed

## 2020-10-28 NOTE — Telephone Encounter (Signed)
Spoke with pt's wife and advised as long as pt has someone responsible to pick him up from the hospital an pt has someone in the house with him tonight it should be fine.  Advised the hospital will send hospital discharge instructions for pt to follow.  Pt's wife verbalizes understanding and thanked Charity fundraiser for the call.

## 2020-10-29 ENCOUNTER — Encounter (HOSPITAL_COMMUNITY): Payer: Self-pay | Admitting: Internal Medicine

## 2020-11-01 MED FILL — Lidocaine HCl Local Inj 1%: INTRAMUSCULAR | Qty: 20 | Status: AC

## 2020-11-05 ENCOUNTER — Ambulatory Visit (INDEPENDENT_AMBULATORY_CARE_PROVIDER_SITE_OTHER): Payer: Medicare Other | Admitting: Emergency Medicine

## 2020-11-05 ENCOUNTER — Other Ambulatory Visit: Payer: Self-pay

## 2020-11-05 DIAGNOSIS — R55 Syncope and collapse: Secondary | ICD-10-CM

## 2020-11-05 DIAGNOSIS — I4892 Unspecified atrial flutter: Secondary | ICD-10-CM

## 2020-11-05 LAB — CUP PACEART INCLINIC DEVICE CHECK
Date Time Interrogation Session: 20220510130358
Implantable Pulse Generator Implant Date: 20220502

## 2020-11-05 NOTE — Patient Instructions (Signed)
Please call the Device Clinic for any further questions or concerns.  Device Clinic [phone]

## 2020-11-05 NOTE — Progress Notes (Signed)
Patient seen today in device clinic for ILR wound check. Steri-strips were removed prior to office visit. Wound appears well healed with edges well approximated. No redness, swelling or drainage noted to site. Remote monitor is transmitting nightly. ROV w/ GT 11/26/20.

## 2020-11-26 ENCOUNTER — Encounter: Payer: Self-pay | Admitting: Internal Medicine

## 2020-11-26 ENCOUNTER — Other Ambulatory Visit: Payer: Self-pay

## 2020-11-26 ENCOUNTER — Ambulatory Visit (INDEPENDENT_AMBULATORY_CARE_PROVIDER_SITE_OTHER): Payer: Medicare Other | Admitting: Internal Medicine

## 2020-11-26 VITALS — BP 152/88 | HR 65 | Ht 68.5 in | Wt 184.6 lb

## 2020-11-26 DIAGNOSIS — R55 Syncope and collapse: Secondary | ICD-10-CM

## 2020-11-26 DIAGNOSIS — I4892 Unspecified atrial flutter: Secondary | ICD-10-CM

## 2020-11-26 NOTE — Patient Instructions (Addendum)
Medication Instructions:  Your physician recommends that you continue on your current medications as directed. Please refer to the Current Medication list given to you today.  Labwork: None ordered.  Testing/Procedures: None ordered.  Follow-Up: Your physician wants you to follow-up in: September 6 at 3:45 pm with Sherryl Manges, MD or one of the following Advanced Practice Providers on your designated Care Team:    Francis Dowse, [place]  Casimiro Needle "Mardelle Matte" Lanna Poche, [place]  Remote monitoring is used to monitor your loop recorder from home.   Any Other Special Instructions Will Be Listed Below (If Applicable).  If you need a refill on your cardiac medications before your next appointment, please call your pharmacy.

## 2020-11-27 NOTE — Progress Notes (Signed)
HPI [name] returns today for followup. He is a pleasant 75 yo man with a h/o syncope and atrial flutter. He underwent catheter ablation of atrial flutter followed by the insertion of a medtronic ILR. He has done well in the interim. He initially did not understand his driving restrictions due to his unexplained syncope. He has not passed out any more. He denies palpitations and remains active with no physical limitations.  Allergies  Allergen Reactions  . Sulfa Antibiotics Nausea And Vomiting     Current Outpatient Medications  Medication Sig Dispense Refill  . apixaban (ELIQUIS) 5 MG TABS tablet Take 1 tablet (5 mg total) by mouth 2 (two) times daily. 60 tablet 2  . atorvastatin (LIPITOR) 10 MG tablet Take 10 mg by mouth every morning.    Marland Kitchen FIBER ADULT GUMMIES PO Take 4 g by mouth every morning.    . folic acid (FOLVITE) 1 MG tablet Take 1 tablet (1 mg total) by mouth daily. 30 tablet 0  . Multiple Vitamin (MULTIVITAMIN WITH MINERALS) TABS tablet Take 1 tablet by mouth every morning.    . sertraline (ZOLOFT) 100 MG tablet Take 150 mg by mouth every morning.    . sodium fluoride (PREVIDENT 5000 PLUS) 1.1 % CREA dental cream Place 1 application onto teeth See admin instructions. Brush at bedtime for 2 minutes, spit out, don't eat, drink or rinse for 30 minutes .    . thiamine 100 MG tablet Take 1 tablet (100 mg total) by mouth daily. 30 tablet 2  . vitamin B-12 (CYANOCOBALAMIN) 250 MCG tablet Take 1 tablet (250 mcg total) by mouth daily. 30 tablet 2   No current facility-administered medications for this visit.     Past Medical History:  Diagnosis Date  . Depression   . HLD (hyperlipidemia)   . HTN (hypertension)   . Polymyalgia rheumatica (HCC)     ROS:   All systems reviewed and negative except as noted in the HPI.   Past Surgical History:  Procedure Laterality Date  . A-FLUTTER ABLATION N/A 10/28/2020   Procedure: A-FLUTTER ABLATION;  Surgeon: Marinus Maw,  MD;  [facility]: [facility] INVASIVE CV LAB;  Service: Cardiovascular;  Laterality: N/A;  . LOOP RECORDER INSERTION N/A 10/28/2020   Procedure: LOOP RECORDER INSERTION;  Surgeon: Marinus Maw, MD;  Location: [facility] INVASIVE CV LAB;  Service: Cardiovascular;  Laterality: N/A;     History reviewed. No pertinent family history.   Social History   Socioeconomic History  . Marital status: Married    Spouse name: Not on file  . Number of children: Not on file  . Years of education: Not on file  . Highest education level: Not on file  Occupational History  . Not on file  Tobacco Use  . Smoking status: Former Games developer  . Smokeless tobacco: Never Used  Vaping Use  . Vaping Use: Never used  Substance and Sexual Activity  . Alcohol use: Yes    Comment: few bourbons every night  . Drug use: Not on file  . Sexual activity: Not on file  Other Topics Concern  . Not on file  Social History Narrative  . Not on file   Social Determinants of Health   Financial Resource Strain: Not on file  Food Insecurity: Not on file  Transportation Needs: Not on file  Physical Activity: Not on file  Stress: Not on file  Social Connections: Not on file  Intimate Partner Violence: Not on file  BP (!) 152/88   Pulse 65   Ht 5' 8.5" (1.74 m)   Wt 184 lb 9.6 oz (83.7 kg)   SpO2 98%   BMI 27.66 kg/m   Physical Exam:  Well appearing NAD HEENT: Unremarkable Neck:  No JVD, no thyromegally Lymphatics:  No adenopathy Back:  No CVA tenderness Lungs:  Clear with no wheezes HEART:  Regular rate rhythm, no murmurs, no rubs, no clicks Abd:  soft, positive bowel sounds, no organomegally, no rebound, no guarding Ext:  2 plus pulses, no edema, no cyanosis, no clubbing Skin:  No rashes no nodules Neuro:  CN II through XII intact, motor grossly intact  EKG - nsr  DEVICE  Normal device function.  See PaceArt for details. 2 very brief episodes of atrial fib. No pauses.  Assess/Plan: 1. Atrial flutter - he is  doing s/p catheter ablation.  2. Atrial fib - he will continue eliquis for now.  3. Syncope - no additional episodes. Previously has had been reminded about driving restrictions. 4. coags - he has had no bleeding and is tolerating the eliquis without symptoms.  Sharlot Gowda Aron Inge,MD

## 2020-12-01 LAB — CUP PACEART REMOTE DEVICE CHECK
Date Time Interrogation Session: 20220604195150
Implantable Pulse Generator Implant Date: 20220502

## 2020-12-02 ENCOUNTER — Ambulatory Visit (INDEPENDENT_AMBULATORY_CARE_PROVIDER_SITE_OTHER): Payer: Medicare Other

## 2020-12-02 DIAGNOSIS — I4892 Unspecified atrial flutter: Secondary | ICD-10-CM

## 2020-12-03 ENCOUNTER — Other Ambulatory Visit: Payer: Self-pay | Admitting: Internal Medicine

## 2020-12-03 NOTE — Telephone Encounter (Signed)
Prescription refill request for Eliquis received. Indication: aflutter Last office visit: Devin Riggs 11/26/2020 Scr: 1.20, 09/30/2020 Age: 75 yo  Weight: 83.7 kg   Pt is on the correct dose of Eliquis per dosing criteria, prescription refill sent for Eliquis 5mg  BID.

## 2020-12-24 NOTE — Progress Notes (Signed)
Carelink Summary Report / Loop Recorder 

## 2021-01-05 LAB — CUP PACEART REMOTE DEVICE CHECK
Date Time Interrogation Session: 20220707195059
Implantable Pulse Generator Implant Date: 20220502

## 2021-01-06 ENCOUNTER — Ambulatory Visit (INDEPENDENT_AMBULATORY_CARE_PROVIDER_SITE_OTHER): Payer: Medicare Other

## 2021-01-06 DIAGNOSIS — R55 Syncope and collapse: Secondary | ICD-10-CM

## 2021-01-09 ENCOUNTER — Telehealth: Payer: Self-pay

## 2021-01-09 NOTE — Telephone Encounter (Signed)
ILR alert reiceved for AF event with elevated VR's. Patient has hx of same. Presenting appears AF w/ RVR, unable duration and not able to send manual transmission d/t ILR 2. Attempted to contact patient to assess. No answer, LMTCB.

## 2021-01-16 NOTE — Telephone Encounter (Signed)
Attempted to contact patient by phone number provided. No answer, LMTCB.

## 2021-01-16 NOTE — Telephone Encounter (Signed)
Devin Riggs is returning [employer]. Please advise.

## 2021-01-16 NOTE — Telephone Encounter (Signed)
Pt monitor updated 01-16-2021

## 2021-01-16 NOTE — Telephone Encounter (Signed)
2nd attempt to contact patient. No answer, LMTCB. 

## 2021-01-16 NOTE — Telephone Encounter (Signed)
The patient returned nurse phone call. A good number is [phone].

## 2021-01-16 NOTE — Telephone Encounter (Signed)
Patient reports he has felt excellent and does not have any complaints. Was unaware when Af w/RVR occurred. Reports compliance with Eliquis 5 mg BID. Patient aware to call if any symptoms arise. Routing to Dr. Graciela Husbands for review.   Patient monitor was last connected 01/11/21 and appears patient was back in SR with controlled rate. Attempted to assist with connecting monitor back but was unsuccessful. Medtronic phone number provided to patient for assistance. Appreciative of call.

## 2021-01-17 NOTE — Telephone Encounter (Signed)
Paroxysmal AF noted

## 2021-01-30 NOTE — Progress Notes (Signed)
Carelink Summary Report / Loop Recorder 

## 2021-02-05 LAB — CUP PACEART REMOTE DEVICE CHECK
Date Time Interrogation Session: 20220809195348
Implantable Pulse Generator Implant Date: 20220502

## 2021-02-06 ENCOUNTER — Ambulatory Visit (INDEPENDENT_AMBULATORY_CARE_PROVIDER_SITE_OTHER): Payer: Medicare Other

## 2021-02-06 DIAGNOSIS — R55 Syncope and collapse: Secondary | ICD-10-CM | POA: Diagnosis not present

## 2021-02-06 DIAGNOSIS — Z20822 Contact with and (suspected) exposure to covid-19: Secondary | ICD-10-CM | POA: Diagnosis not present

## 2021-02-06 DIAGNOSIS — R058 Other specified cough: Secondary | ICD-10-CM | POA: Diagnosis not present

## 2021-02-06 DIAGNOSIS — U071 COVID-19: Secondary | ICD-10-CM | POA: Diagnosis not present

## 2021-02-24 DIAGNOSIS — H35371 Puckering of macula, right eye: Secondary | ICD-10-CM | POA: Diagnosis not present

## 2021-02-24 DIAGNOSIS — H2513 Age-related nuclear cataract, bilateral: Secondary | ICD-10-CM | POA: Diagnosis not present

## 2021-02-24 DIAGNOSIS — H25013 Cortical age-related cataract, bilateral: Secondary | ICD-10-CM | POA: Diagnosis not present

## 2021-02-24 DIAGNOSIS — H43813 Vitreous degeneration, bilateral: Secondary | ICD-10-CM | POA: Diagnosis not present

## 2021-02-26 DIAGNOSIS — U071 COVID-19: Secondary | ICD-10-CM | POA: Diagnosis not present

## 2021-02-27 NOTE — Progress Notes (Signed)
Carelink Summary Report / Loop Recorder 

## 2021-03-04 ENCOUNTER — Ambulatory Visit (INDEPENDENT_AMBULATORY_CARE_PROVIDER_SITE_OTHER): Payer: Medicare Other | Admitting: Internal Medicine

## 2021-03-04 ENCOUNTER — Other Ambulatory Visit: Payer: Self-pay

## 2021-03-04 ENCOUNTER — Encounter: Payer: Self-pay | Admitting: Internal Medicine

## 2021-03-04 VITALS — BP 142/84 | HR 58 | Ht 68.5 in | Wt 185.8 lb

## 2021-03-04 DIAGNOSIS — I4892 Unspecified atrial flutter: Secondary | ICD-10-CM | POA: Diagnosis not present

## 2021-03-04 MED ORDER — LISINOPRIL 10 MG PO TABS: 10.0000 mg | ORAL_TABLET | Freq: Every day | ORAL | 3 refills | Status: DC

## 2021-03-04 NOTE — Progress Notes (Signed)
Patient Care Team: Darrow Bussing, MD as PCP - General (Family Medicine) Duke Salvia, MD as PCP - Cardiology (Cardiology)   HPI  Devin Riggs is a 75 y.o. male Seen in follow-up for syncope and atrial flutter which was largely asymptomatic for which he underwent catheter ablation Dr. Leonia Reeves (5/22).  Neuro evaluation was negative for syncope.  He underwent implantation of a loop recorder.  His alcohol intake in the past was noted to greater than 4 ounces of bourbon per day; continues to imbibe  No interval palpitations.  Denies chest pain shortness of breath or edema.    DATE TEST EF    4/22 Echo   55-60 %               Date Cr K Hgb  4/22 1.2 4.6 14.2           Records and Results Reviewed   Past Medical History:  Diagnosis Date   Depression    HLD (hyperlipidemia)    HTN (hypertension)    Polymyalgia rheumatica (HCC)     Past Surgical History:  Procedure Laterality Date   A-FLUTTER ABLATION N/A 10/28/2020   Procedure: A-FLUTTER ABLATION;  Surgeon: Marinus Maw, MD;  Location: [facility] INVASIVE CV LAB;  Service: Cardiovascular;  Laterality: N/A;   LOOP RECORDER INSERTION N/A 10/28/2020   Procedure: LOOP RECORDER INSERTION;  Surgeon: Marinus Maw, MD;  Location: [facility] INVASIVE CV LAB;  Service: Cardiovascular;  Laterality: N/A;    Current Meds  Medication Sig   ELIQUIS 5 MG TABS tablet TAKE 1 TABLET BY MOUTH TWICE A DAY   FIBER ADULT GUMMIES PO Take 4 g by mouth every morning.   Multiple Vitamin (MULTIVITAMIN WITH MINERALS) TABS tablet Take 1 tablet by mouth every morning.   sertraline (ZOLOFT) 100 MG tablet Take 150 mg by mouth every morning.   sodium fluoride (PREVIDENT 5000 PLUS) 1.1 % CREA dental cream Place 1 application onto teeth See admin instructions. Brush at bedtime for 2 minutes, spit out, don't eat, drink or rinse for 30 minutes .    Allergies  Allergen Reactions   Sulfa Antibiotics Nausea And Vomiting      Review of Systems negative  except from HPI and PMH  Physical Exam BP (!) 142/84   Pulse (!) 58   Ht 5' 8.5" (1.74 m)   Wt 185 lb 12.8 oz (84.3 kg)   SpO2 97%   BMI 27.84 kg/m  Well developed and well nourished in no acute distress HENT normal E scleral and icterus clear Neck Supple JVP flat; carotids brisk and full Clear to ausculation Regular rate and rhythm, no murmurs gallops or rub Soft with active bowel sounds No clubbing cyanosis  Edema Alert and oriented, grossly normal motor and sensory function Skin Warm and Dry  ECG sinus at 58 Interval 18/10/42 Otherwise normal  CrCl cannot be calculated (Patient's most recent lab result is older than the maximum 21 days allowed.).   Assessment and  Plan Atrial fibrillation/flutter  Hypertension  Implantable loop recorder  Alcohol use > 4 oz/d  The patient is having post flutter ablation atrial fibrillation.  Longest episodes detected by his device were about 4 hours.  This would be SCAF.  Lengthy discussion regarding the role of anticoagulation and SCAF reviewing both the Assert 2 data as well as the [employer]..  He has elected to continue the Eliquis.  He was under the impression that he was to stop  his lisinopril.  His blood pressures have been 140+ at home.  We will resume it at 10 mg a day.  Encouraged him again to decrease his alcohol intake.  Is reminded that his driving restriction ends in October.      Current medicines are reviewed at length with the patient today .  The patient does not  have concerns regarding medicines.

## 2021-03-04 NOTE — Patient Instructions (Signed)
Medication Instructions:  Your physician has recommended you make the following change in your medication:   ** Begin Lisinopril 10mg  - 1 tablet by mouth daily.   *If you need a refill on your cardiac medications before your next appointment, please call your pharmacy*   Lab Work: None ordered.  If you have labs (blood work) drawn today and your tests are completely normal, you will receive your results only by: MyChart Message (if you have MyChart) OR A paper copy in the mail If you have any lab test that is abnormal or we need to change your treatment, we will call you to review the results.   Testing/Procedures: None ordered.    Follow-Up: At [facility], you and your health needs are our priority.  As part of our continuing mission to provide you with exceptional heart care, we have created designated Provider Care Teams.  These Care Teams include your primary Cardiologist (physician) and Advanced Practice Providers (APPs -  Physician Assistants and Nurse Practitioners) who all work together to provide you with the care you need, when you need it.  We recommend signing up for the patient portal called "MyChart".  Sign up information is provided on this After Visit Summary.  MyChart is used to connect with patients for Virtual Visits (Telemedicine).  Patients are able to view lab/test results, encounter notes, upcoming appointments, etc.  Non-urgent messages can be sent to your provider as well.   To learn more about what you can do with MyChart, go to [facility].    Your next appointment:   12 month(s)  The format for your next appointment:   In Person  Provider:   [email or web address], MD

## 2021-03-11 ENCOUNTER — Ambulatory Visit (INDEPENDENT_AMBULATORY_CARE_PROVIDER_SITE_OTHER): Payer: Medicare Other

## 2021-03-11 DIAGNOSIS — R55 Syncope and collapse: Secondary | ICD-10-CM

## 2021-03-11 LAB — CUP PACEART REMOTE DEVICE CHECK
Date Time Interrogation Session: 20220911195003
Implantable Pulse Generator Implant Date: 20220502

## 2021-03-19 NOTE — Progress Notes (Signed)
Carelink Summary Report / Loop Recorder 

## 2021-03-25 DIAGNOSIS — Z23 Encounter for immunization: Secondary | ICD-10-CM | POA: Diagnosis not present

## 2021-04-14 ENCOUNTER — Ambulatory Visit (INDEPENDENT_AMBULATORY_CARE_PROVIDER_SITE_OTHER): Payer: Medicare Other

## 2021-04-14 DIAGNOSIS — R55 Syncope and collapse: Secondary | ICD-10-CM | POA: Diagnosis not present

## 2021-04-15 LAB — CUP PACEART REMOTE DEVICE CHECK
Date Time Interrogation Session: 20221014195206
Implantable Pulse Generator Implant Date: 20220502

## 2021-04-23 NOTE — Progress Notes (Signed)
Carelink Summary Report / Loop Recorder 

## 2021-05-15 LAB — CUP PACEART REMOTE DEVICE CHECK
Date Time Interrogation Session: 20221116195211
Implantable Pulse Generator Implant Date: 20220502

## 2021-05-19 ENCOUNTER — Ambulatory Visit (INDEPENDENT_AMBULATORY_CARE_PROVIDER_SITE_OTHER): Payer: Medicare Other

## 2021-05-19 DIAGNOSIS — R55 Syncope and collapse: Secondary | ICD-10-CM | POA: Diagnosis not present

## 2021-05-28 NOTE — Progress Notes (Signed)
Carelink Summary Report / Loop Recorder 

## 2021-05-30 ENCOUNTER — Other Ambulatory Visit: Payer: Self-pay | Admitting: Internal Medicine

## 2021-05-30 NOTE — Telephone Encounter (Signed)
Prescription refill request for Eliquis received. Indication: Aflutter/Afib  Last office visit: 03/04/21 Graciela Husbands)  Scr: 1.20 (09/30/20)  Age: 75 Weight: 84.3kg  Appropriate dose and refill sent to requested pharmacy.

## 2021-06-09 DIAGNOSIS — Z0001 Encounter for general adult medical examination with abnormal findings: Secondary | ICD-10-CM | POA: Diagnosis not present

## 2021-06-09 DIAGNOSIS — Z79899 Other long term (current) drug therapy: Secondary | ICD-10-CM | POA: Diagnosis not present

## 2021-06-09 DIAGNOSIS — I10 Essential (primary) hypertension: Secondary | ICD-10-CM | POA: Diagnosis not present

## 2021-06-09 DIAGNOSIS — E7800 Pure hypercholesterolemia, unspecified: Secondary | ICD-10-CM | POA: Diagnosis not present

## 2021-06-09 DIAGNOSIS — F419 Anxiety disorder, unspecified: Secondary | ICD-10-CM | POA: Diagnosis not present

## 2021-06-09 DIAGNOSIS — I4891 Unspecified atrial fibrillation: Secondary | ICD-10-CM | POA: Diagnosis not present

## 2021-06-10 DIAGNOSIS — Z23 Encounter for immunization: Secondary | ICD-10-CM | POA: Diagnosis not present

## 2021-06-15 DIAGNOSIS — U071 COVID-19: Secondary | ICD-10-CM | POA: Diagnosis not present

## 2021-06-17 LAB — CUP PACEART REMOTE DEVICE CHECK
Date Time Interrogation Session: 20221219195109
Implantable Pulse Generator Implant Date: 20220502

## 2021-06-19 ENCOUNTER — Ambulatory Visit (INDEPENDENT_AMBULATORY_CARE_PROVIDER_SITE_OTHER): Payer: Medicare Other

## 2021-06-19 DIAGNOSIS — R55 Syncope and collapse: Secondary | ICD-10-CM

## 2021-07-01 NOTE — Progress Notes (Signed)
Carelink Summary Report / Loop Recorder 

## 2021-07-02 DIAGNOSIS — Z0001 Encounter for general adult medical examination with abnormal findings: Secondary | ICD-10-CM | POA: Diagnosis not present

## 2021-07-22 ENCOUNTER — Ambulatory Visit (INDEPENDENT_AMBULATORY_CARE_PROVIDER_SITE_OTHER): Payer: Medicare Other

## 2021-07-22 DIAGNOSIS — R55 Syncope and collapse: Secondary | ICD-10-CM | POA: Diagnosis not present

## 2021-07-22 LAB — CUP PACEART REMOTE DEVICE CHECK
Date Time Interrogation Session: 20230123230709
Implantable Pulse Generator Implant Date: 20220502

## 2021-07-31 ENCOUNTER — Telehealth: Payer: Self-pay | Admitting: Internal Medicine

## 2021-07-31 NOTE — Telephone Encounter (Signed)
New message   Patient walked in wanting to let the nurse know that his pharmacy is now [facility].  Please send all future prescriptions to this pharmacy.  Their telephone number is [phone] and their fax number is [phone].  Thank you

## 2021-08-01 NOTE — Progress Notes (Signed)
Carelink Summary Report / Loop Recorder 

## 2021-08-01 NOTE — Telephone Encounter (Signed)
Chart updated as per pt request

## 2021-08-25 ENCOUNTER — Ambulatory Visit (INDEPENDENT_AMBULATORY_CARE_PROVIDER_SITE_OTHER): Payer: Medicare Other

## 2021-08-25 DIAGNOSIS — R55 Syncope and collapse: Secondary | ICD-10-CM

## 2021-08-25 LAB — CUP PACEART REMOTE DEVICE CHECK
Date Time Interrogation Session: 20230225230240
Implantable Pulse Generator Implant Date: 20220502

## 2021-09-01 NOTE — Progress Notes (Signed)
Carelink Summary Report / Loop Recorder 

## 2021-09-15 ENCOUNTER — Other Ambulatory Visit: Payer: Self-pay | Admitting: *Deleted

## 2021-09-15 DIAGNOSIS — Z8679 Personal history of other diseases of the circulatory system: Secondary | ICD-10-CM

## 2021-09-15 DIAGNOSIS — I4892 Unspecified atrial flutter: Secondary | ICD-10-CM

## 2021-09-15 MED ORDER — APIXABAN 5 MG PO TABS: 5.0000 mg | ORAL_TABLET | Freq: Two times a day (BID) | ORAL | 1 refills | Status: DC

## 2021-09-15 NOTE — Telephone Encounter (Signed)
Eliquis 5mg  paper refill request received. Patient is 76 years old, weight-84.3kg, Crea-1.14 on 06/09/2021 via [place] from [place], Diagnosis-Aflutter, and last seen by Dr. Caryl Comes on 03/04/2021. Dose is appropriate based on dosing criteria. Will send in refill to requested pharmacy.   ?

## 2021-09-29 ENCOUNTER — Ambulatory Visit (INDEPENDENT_AMBULATORY_CARE_PROVIDER_SITE_OTHER): Payer: Medicare Other

## 2021-09-29 DIAGNOSIS — R55 Syncope and collapse: Secondary | ICD-10-CM | POA: Diagnosis not present

## 2021-09-30 LAB — CUP PACEART REMOTE DEVICE CHECK
Date Time Interrogation Session: 20230404122741
Implantable Pulse Generator Implant Date: 20220502

## 2021-10-14 NOTE — Progress Notes (Signed)
Carelink Summary Report / Loop Recorder 

## 2021-10-30 ENCOUNTER — Ambulatory Visit (INDEPENDENT_AMBULATORY_CARE_PROVIDER_SITE_OTHER): Payer: Medicare Other

## 2021-10-30 DIAGNOSIS — R55 Syncope and collapse: Secondary | ICD-10-CM | POA: Diagnosis not present

## 2021-10-30 LAB — CUP PACEART REMOTE DEVICE CHECK
Date Time Interrogation Session: 20230503230804
Implantable Pulse Generator Implant Date: 20220502

## 2021-11-12 NOTE — Progress Notes (Signed)
Carelink Summary Report / Loop Recorder 

## 2021-12-02 ENCOUNTER — Ambulatory Visit (INDEPENDENT_AMBULATORY_CARE_PROVIDER_SITE_OTHER): Payer: Medicare Other

## 2021-12-02 DIAGNOSIS — R55 Syncope and collapse: Secondary | ICD-10-CM

## 2021-12-03 LAB — CUP PACEART REMOTE DEVICE CHECK
Date Time Interrogation Session: 20230607041807
Implantable Pulse Generator Implant Date: 20220502

## 2021-12-17 NOTE — Progress Notes (Signed)
Carelink Summary Report / Loop Recorder 

## 2022-01-06 ENCOUNTER — Ambulatory Visit (INDEPENDENT_AMBULATORY_CARE_PROVIDER_SITE_OTHER): Payer: Medicare Other

## 2022-01-06 DIAGNOSIS — R55 Syncope and collapse: Secondary | ICD-10-CM | POA: Diagnosis not present

## 2022-01-06 LAB — CUP PACEART REMOTE DEVICE CHECK
Date Time Interrogation Session: 20230708230422
Implantable Pulse Generator Implant Date: 20220502

## 2022-01-19 ENCOUNTER — Other Ambulatory Visit: Payer: Self-pay | Admitting: Internal Medicine

## 2022-01-19 DIAGNOSIS — Z8679 Personal history of other diseases of the circulatory system: Secondary | ICD-10-CM

## 2022-01-20 NOTE — Telephone Encounter (Signed)
Prescription refill request for Eliquis received. Indication:Aflutter Last office visit:12/22 Scr:1.1 Age: 76 Weight:84.3 kg  Prescription refilled

## 2022-01-22 IMAGING — CT CT ANGIO CHEST
2 of 7 series · 17 of 46 positions shown · IV contrast (APPLIED)
Comparison: Chest radiographs 09/28/2020. CT Abdomen and Pelvis
[HOSPITAL] 01/17/2019.

CLINICAL DATA: 75-year-old male with syncope. Left chest pain.
Former smoker.

EXAM:
CT ANGIOGRAPHY CHEST WITH CONTRAST
TECHNIQUE: Multidetector CT imaging of the chest was performed using the
standard protocol during bolus administration of intravenous
contrast. Multiplanar CT image reconstructions and MIPs were
obtained to evaluate the vascular anatomy.
CONTRAST:  100mL OMNIPAQUE IOHEXOL 350 MG/ML SOLN

[Series 5: thins · axial · 0.83mm/px · z∈[-356,-77]mm · 15 of 319 slices shown]
[im 20/319  lung]
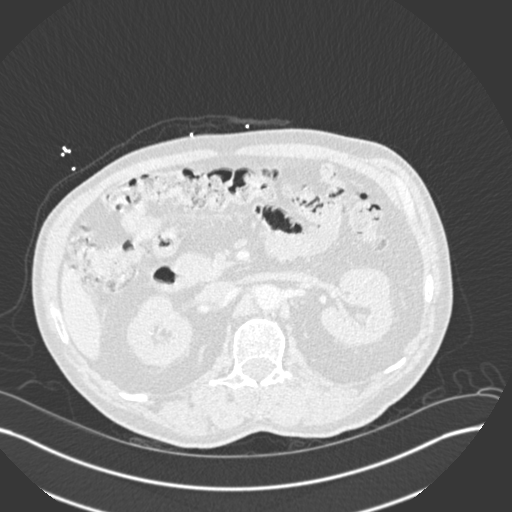
[im 40/319  soft-tissue]
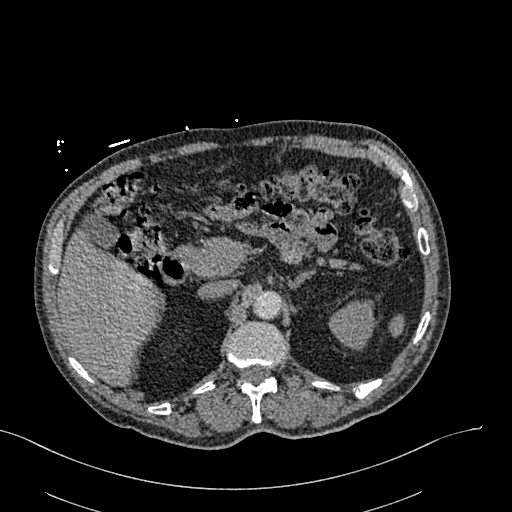
[im 60/319  lung]
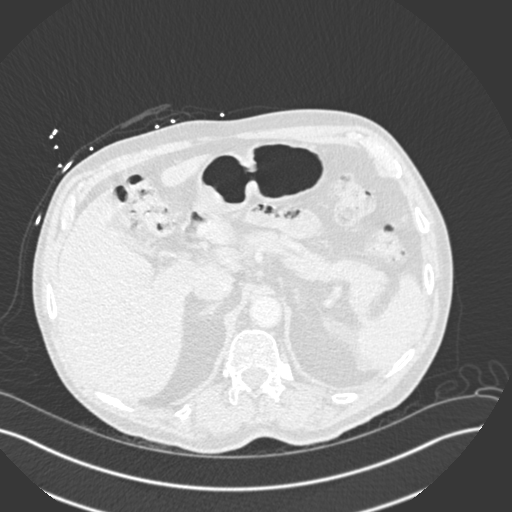
[im 80/319  soft-tissue]
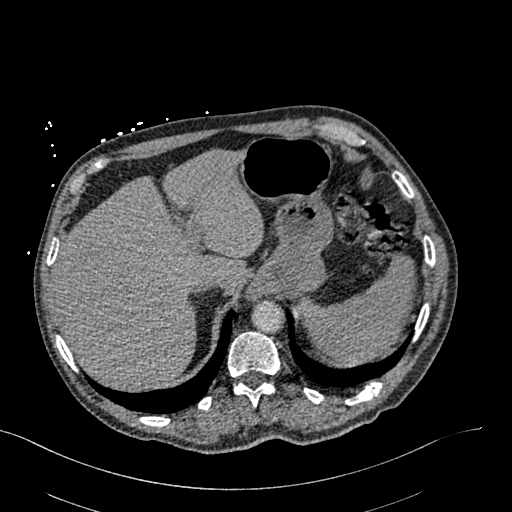
[im 100/319  lung]
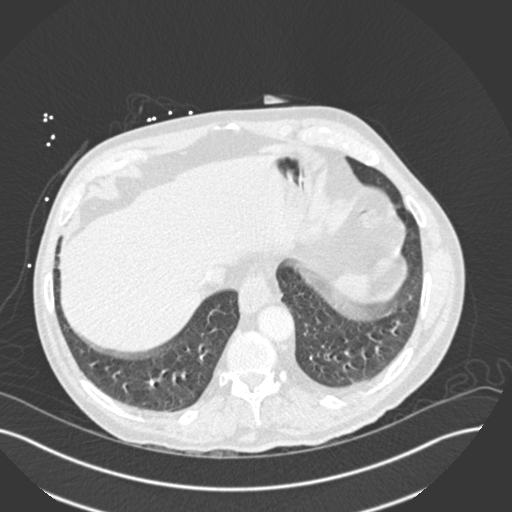
[im 120/319  soft-tissue]
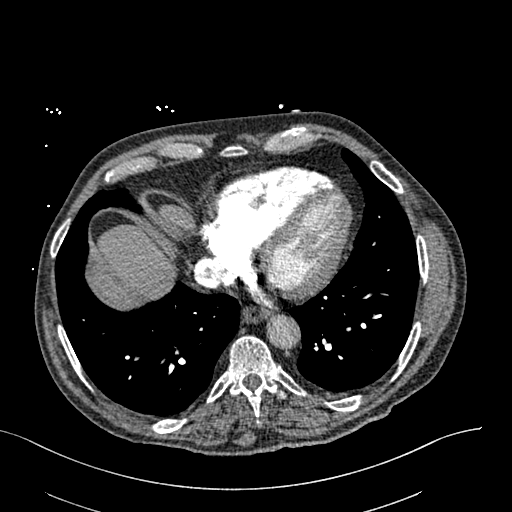
[im 140/319  lung]
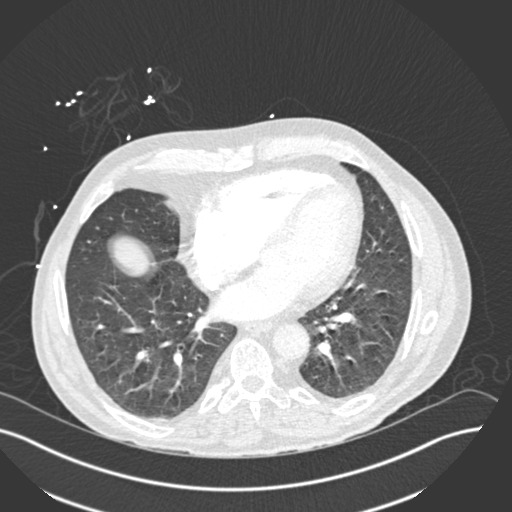
[im 160/319  soft-tissue]
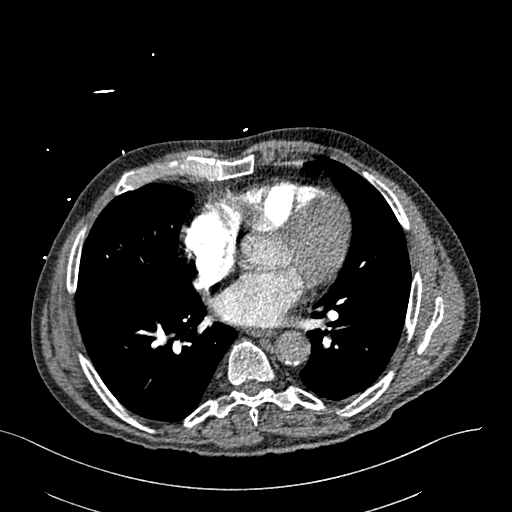
[im 179/319  lung]
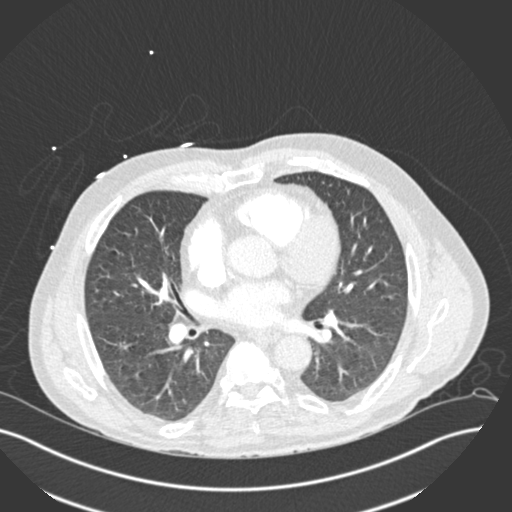
[im 199/319  soft-tissue]
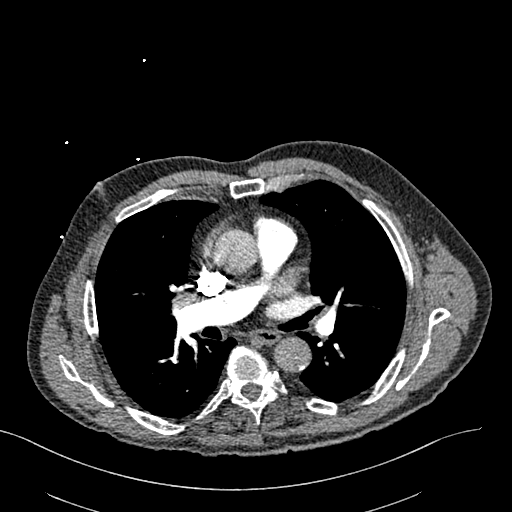
[im 219/319  lung]
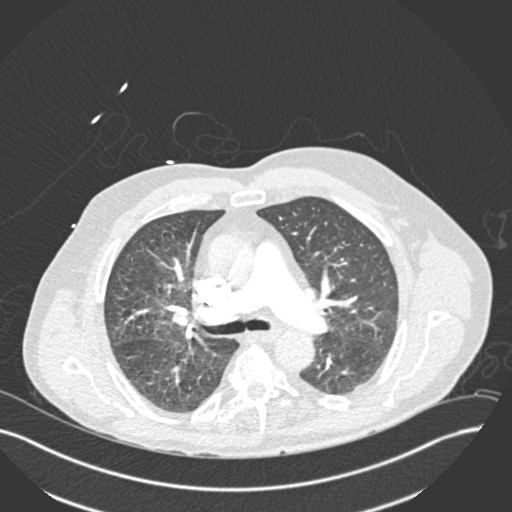
[im 239/319  soft-tissue]
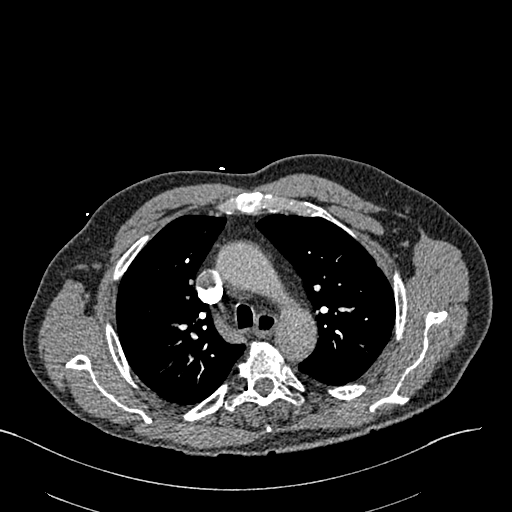
[im 259/319  lung]
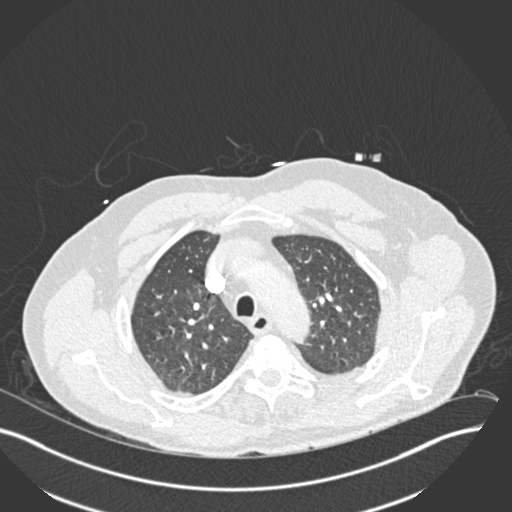
[im 279/319  soft-tissue]
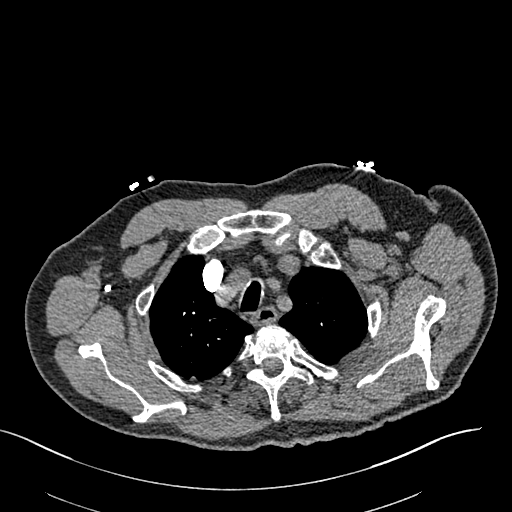
[im 299/319  lung]
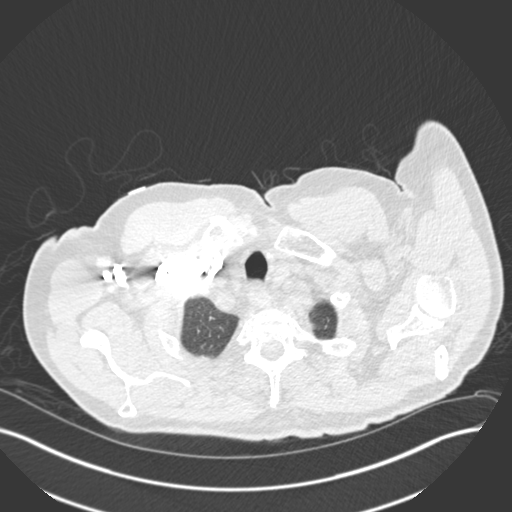

[Series 7: coronal mpr · coronal · 0.67mm/px · 2 of 106 slices shown]
[im 36/106  soft-tissue]
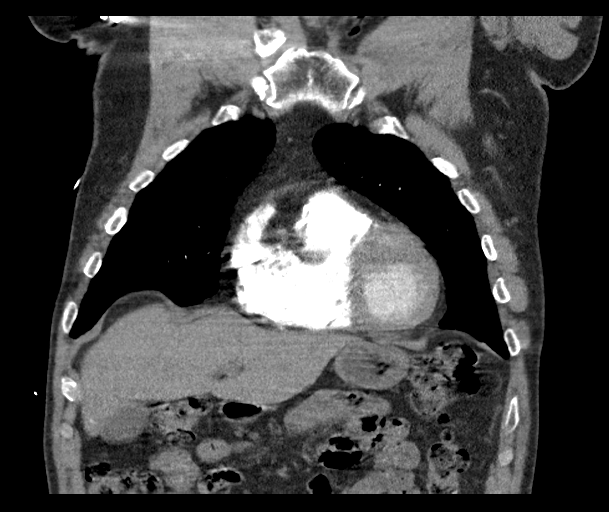
[im 71/106  soft-tissue]
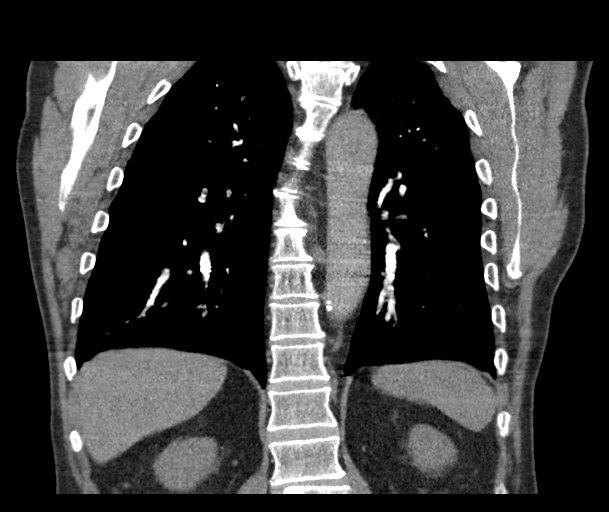

[17 of 46 positions shown; findings below may reference images not displayed]

FINDINGS: Cardiovascular: Good contrast bolus timing in the pulmonary arterial
tree. Respiratory motion at the lung bases but adequate pulmonary
artery detail.

No focal filling defect identified in the pulmonary arteries to
suggest acute pulmonary embolism.

Little to no contrast in the aorta. Calcified aortic
atherosclerosis. Calcified coronary artery atherosclerosis. Cardiac
size at the upper limits of normal. No pericardial effusion.

Mediastinum/Nodes: Negative aside from small gastric hiatal hernia.
No lymphadenopathy

Lungs/Pleura: Major airways are patent. Relatively normal lung
volumes. Mild respiratory motion. Well aerated lungs. No pleural
effusion or acute appearing pulmonary opacity.

Upper Abdomen: Cholelithiasis on series 4, image 90, but the
gallbladder appears relatively decompressed. Negative visible
noncontrast liver, spleen, pancreas, adrenal glands, kidneys, and
proximal bowel in the upper abdomen. There is fairly extensive
diverticulosis of the visible large bowel.

Musculoskeletal: Dextroconvex thoracic scoliosis. Thoracic
degenerative changes. Mild osteopenia. Subacute appearing unhealed
left posterior rib fractures, ribs 10 and 11. Non to minimally
displaced anterior left rib fractures are age indeterminate at ribs
4 through 6.

Review of the MIP images confirms the above findings.
IMPRESSION: 1. Negative for acute pulmonary embolus. No acute lung finding.

2. Subacute but unhealed left posterior 10th and 11th rib fractures.
Age indeterminate nondisplaced anterior left 4th through 6th rib
fractures.

3. Cholelithiasis. Diverticulosis of the large bowel. Small gastric
hiatal hernia.

4. Calcified coronary artery and Aortic Atherosclerosis
([id number]).

## 2022-01-28 NOTE — Progress Notes (Signed)
Carelink Summary Report / Loop Recorder 

## 2022-02-09 ENCOUNTER — Ambulatory Visit: Payer: Medicare Other

## 2022-02-10 LAB — CUP PACEART REMOTE DEVICE CHECK
Date Time Interrogation Session: 20230813231907
Implantable Pulse Generator Implant Date: 20220502

## 2022-03-16 ENCOUNTER — Ambulatory Visit (INDEPENDENT_AMBULATORY_CARE_PROVIDER_SITE_OTHER): Payer: Medicare Other

## 2022-03-16 DIAGNOSIS — R55 Syncope and collapse: Secondary | ICD-10-CM | POA: Diagnosis not present

## 2022-03-17 LAB — CUP PACEART REMOTE DEVICE CHECK
Date Time Interrogation Session: 20230915231121
Implantable Pulse Generator Implant Date: 20220502

## 2022-03-31 NOTE — Progress Notes (Signed)
Carelink Summary Report / Loop Recorder 

## 2022-04-20 ENCOUNTER — Ambulatory Visit (INDEPENDENT_AMBULATORY_CARE_PROVIDER_SITE_OTHER): Payer: Medicare Other

## 2022-04-20 DIAGNOSIS — R55 Syncope and collapse: Secondary | ICD-10-CM | POA: Diagnosis not present

## 2022-04-21 LAB — CUP PACEART REMOTE DEVICE CHECK
Date Time Interrogation Session: 20231022231549
Implantable Pulse Generator Implant Date: 20220502

## 2022-04-28 ENCOUNTER — Telehealth: Payer: Self-pay

## 2022-04-28 NOTE — Telephone Encounter (Signed)
-----   Message from Deboraha Sprang, MD sent at 04/26/2022  3:50 PM EDT ----- Christian Mate  lets stop his monthly checks  we know he has afib and are not using the data at this frequency  we can hve him come in annually  Thanks SK

## 2022-04-28 NOTE — Telephone Encounter (Signed)
I canceled all upcoming remote appointments, and took patient out of [facility].

## 2022-05-18 NOTE — Progress Notes (Signed)
Carelink Summary Report / Loop Recorder 

## 2022-12-28 ENCOUNTER — Other Ambulatory Visit: Payer: Self-pay | Admitting: Internal Medicine

## 2022-12-28 DIAGNOSIS — Z8679 Personal history of other diseases of the circulatory system: Secondary | ICD-10-CM

## 2022-12-29 NOTE — Telephone Encounter (Signed)
Prescription refill request for Eliquis received. Indication:aflutter Last office visit:needs appt Scr:1.1  12/23 Age: 77 Weight:84.3  kg  Prescription refilled

## 2023-01-19 ENCOUNTER — Other Ambulatory Visit: Payer: Self-pay | Admitting: Internal Medicine

## 2023-01-19 DIAGNOSIS — Z8679 Personal history of other diseases of the circulatory system: Secondary | ICD-10-CM

## 2023-01-20 NOTE — Telephone Encounter (Signed)
Pt has scheduled appt with Dr Graciela Husbands on 05/05/23. Refill sent to prevent any missed doses.

## 2023-01-20 NOTE — Telephone Encounter (Signed)
Eliquis 5mg  refill request received. Patient is 77 years old, weight-84.3kg, Crea-1.0 on 06/16/22 via [facility] from [place], Diagnosis-Aflutter, and last seen by Dr. Graciela Husbands on 03/04/2021. Dose is appropriate based on dosing criteria.   Pt needs an appt with Cardiologist. Will send a message to Schedulers.

## 2023-03-08 DIAGNOSIS — H25813 Combined forms of age-related cataract, bilateral: Secondary | ICD-10-CM | POA: Diagnosis not present

## 2023-03-08 DIAGNOSIS — H35362 Drusen (degenerative) of macula, left eye: Secondary | ICD-10-CM | POA: Diagnosis not present

## 2023-03-08 DIAGNOSIS — H35371 Puckering of macula, right eye: Secondary | ICD-10-CM | POA: Diagnosis not present

## 2023-03-08 DIAGNOSIS — H02831 Dermatochalasis of right upper eyelid: Secondary | ICD-10-CM | POA: Diagnosis not present

## 2023-03-21 DIAGNOSIS — Z23 Encounter for immunization: Secondary | ICD-10-CM | POA: Diagnosis not present

## 2023-03-22 DIAGNOSIS — S61213A Laceration without foreign body of left middle finger without damage to nail, initial encounter: Secondary | ICD-10-CM | POA: Diagnosis not present

## 2023-03-22 DIAGNOSIS — S60032A Contusion of left middle finger without damage to nail, initial encounter: Secondary | ICD-10-CM | POA: Diagnosis not present

## 2023-03-22 DIAGNOSIS — L03012 Cellulitis of left finger: Secondary | ICD-10-CM | POA: Diagnosis not present

## 2023-03-25 DIAGNOSIS — T148XXA Other injury of unspecified body region, initial encounter: Secondary | ICD-10-CM | POA: Diagnosis not present

## 2023-03-25 DIAGNOSIS — Z23 Encounter for immunization: Secondary | ICD-10-CM | POA: Diagnosis not present

## 2023-03-25 DIAGNOSIS — S6992XA Unspecified injury of left wrist, hand and finger(s), initial encounter: Secondary | ICD-10-CM | POA: Diagnosis not present

## 2023-03-25 DIAGNOSIS — I480 Paroxysmal atrial fibrillation: Secondary | ICD-10-CM | POA: Diagnosis not present

## 2023-03-25 DIAGNOSIS — I10 Essential (primary) hypertension: Secondary | ICD-10-CM | POA: Diagnosis not present

## 2023-03-25 DIAGNOSIS — S61213A Laceration without foreign body of left middle finger without damage to nail, initial encounter: Secondary | ICD-10-CM | POA: Diagnosis not present

## 2023-03-25 DIAGNOSIS — D6869 Other thrombophilia: Secondary | ICD-10-CM | POA: Diagnosis not present

## 2023-03-29 ENCOUNTER — Other Ambulatory Visit: Payer: Self-pay | Admitting: Internal Medicine

## 2023-03-29 DIAGNOSIS — Z8679 Personal history of other diseases of the circulatory system: Secondary | ICD-10-CM

## 2023-03-29 NOTE — Telephone Encounter (Signed)
Prescription refill request for Eliquis received. Indication:aflutter Last office visit:upcoming Scr:1.1  12/23 Age: 77 Weight:84.3  kg  Prescription refilled

## 2023-04-01 DIAGNOSIS — S61213A Laceration without foreign body of left middle finger without damage to nail, initial encounter: Secondary | ICD-10-CM | POA: Diagnosis not present

## 2023-04-26 DIAGNOSIS — S61213A Laceration without foreign body of left middle finger without damage to nail, initial encounter: Secondary | ICD-10-CM | POA: Diagnosis not present

## 2023-05-05 ENCOUNTER — Encounter: Payer: Self-pay | Admitting: Internal Medicine

## 2023-05-05 ENCOUNTER — Ambulatory Visit: Payer: Medicare Other | Attending: Internal Medicine | Admitting: Internal Medicine

## 2023-05-05 VITALS — BP 128/76 | HR 58 | Ht 68.5 in | Wt 182.0 lb

## 2023-05-05 DIAGNOSIS — I4892 Unspecified atrial flutter: Secondary | ICD-10-CM | POA: Diagnosis not present

## 2023-05-05 DIAGNOSIS — I159 Secondary hypertension, unspecified: Secondary | ICD-10-CM | POA: Diagnosis not present

## 2023-05-05 MED ORDER — LOSARTAN POTASSIUM 25 MG PO TABS: 25.0000 mg | ORAL_TABLET | Freq: Every day | ORAL | 3 refills | Status: AC

## 2023-05-05 NOTE — Patient Instructions (Signed)
Medication Instructions:  Your physician has recommended you make the following change in your medication:   ** Stop Lisinopril  ** Start Losartan 25mg  - 1 tablet by mouth daily  *If you need a refill on your cardiac medications before your next appointment, please call your pharmacy*   Lab Work: None ordered.  If you have labs (blood work) drawn today and your tests are completely normal, you will receive your results only by: MyChart Message (if you have MyChart) OR A paper copy in the mail If you have any lab test that is abnormal or we need to change your treatment, we will call you to review the results.   Testing/Procedures: None ordered.    Follow-Up: At [facility], you and your health needs are our priority.  As part of our continuing mission to provide you with exceptional heart care, we have created designated Provider Care Teams.  These Care Teams include your primary Cardiologist (physician) and Advanced Practice Providers (APPs -  Physician Assistants and Nurse Practitioners) who all work together to provide you with the care you need, when you need it.  We recommend signing up for the patient portal called "MyChart".  Sign up information is provided on this After Visit Summary.  MyChart is used to connect with patients for Virtual Visits (Telemedicine).  Patients are able to view lab/test results, encounter notes, upcoming appointments, etc.  Non-urgent messages can be sent to your provider as well.   To learn more about what you can do with MyChart, go to [email or web address].    Your next appointment:   12 months with Dr Graciela Husbands

## 2023-05-05 NOTE — Progress Notes (Signed)
Patient Care Team: Darrow Bussing, MD as PCP - General (Family Medicine) Duke Salvia, MD as PCP - Cardiology (Cardiology)   HPI  Devin Riggs is a 77 y.o. male Seen in follow-up for syncope and atrial flutter which was largely asymptomatic for which he underwent catheter ablation Dr. Leonia Reeves (5/22).  Neuro evaluation was negative for syncope.  He underwent implantation of a loop recorder.  His alcohol intake in the past was noted to greater than 4 ounces of bourbon per day; continues to imbibe  The patient denies chest pain, shortness of breath, nocturnal dyspnea, orthopnea or peripheral edema.  There have been no palpitations, lightheadedness or syncope.   Cough, dry, on lisinopril  No bleeding    DATE TEST EF    4/22 Echo   55-60 %               Date Cr K Hgb  4/22 1.2 4.6 14.2  11/23  1.3 5.3 14.3   //  Records and Results Reviewed   Past Medical History:  Diagnosis Date   Depression    HLD (hyperlipidemia)    HTN (hypertension)    Polymyalgia rheumatica (HCC)     Past Surgical History:  Procedure Laterality Date   A-FLUTTER ABLATION N/A 10/28/2020   Procedure: A-FLUTTER ABLATION;  Surgeon: Marinus Maw, MD;  Location: [facility];  Service: Cardiovascular;  Laterality: N/A;   LOOP RECORDER INSERTION N/A 10/28/2020   Procedure: LOOP RECORDER INSERTION;  Surgeon: Marinus Maw, MD;  Location: [facility];  Service: Cardiovascular;  Laterality: N/A;    Current Meds  Medication Sig   atorvastatin (LIPITOR) 10 MG tablet Take 10 mg by mouth every morning.   ELIQUIS 5 MG TABS tablet TAKE 1 TABLET BY MOUTH TWICE  DAILY   lisinopril (ZESTRIL) 10 MG tablet Take 1 tablet (10 mg total) by mouth daily.   Multiple Vitamin (MULTIVITAMIN WITH MINERALS) TABS tablet Take 1 tablet by mouth every morning.   sertraline (ZOLOFT) 100 MG tablet Take 150 mg by mouth every morning.   sodium fluoride (PREVIDENT 5000 PLUS) 1.1 % CREA dental cream Place 1  application onto teeth See admin instructions. Brush at bedtime for 2 minutes, spit out, don't eat, drink or rinse for 30 minutes .    Allergies  Allergen Reactions   Sulfa Antibiotics Nausea And Vomiting      Review of Systems negative except from HPI and PMH  Physical Exam BP 128/76   Pulse (!) 58   Ht 5' 8.5" (1.74 m)   Wt 182 lb (82.6 kg)   SpO2 98%   BMI 27.27 kg/m  Well developed and well nourished in no acute distress HENT normal Neck supple  Clear Device pocket well healed; without hematoma or erythema.  There is no tethering  Regular rate and rhythm, no  gallop No murmur Abd-soft  No Clubbing cyanosis  edema Skin-warm and dry A & Oriented  Grossly normal sensory and motor function  ECG sinus @ 58 18/09/42     CrCl cannot be calculated (Patient's most recent lab result is older than the maximum 21 days allowed.).   Assessment and  Plan Atrial fibrillation/flutter  Hypertension  Hypokalemia-borderline  Implantable loop recorder  Alcohol use > 4 oz/d   No interval atrial fibrillation.  Continue anticoagulation  Blood pressure well-controlled but has a cough with lisinopril.  Will discontinue and start him on losartan  He is to get his blood  work drawn with his PCP in the next couple of weeks.  I have given him a sheet to make sure that his potassium is normal on the next lab draw   Current medicines are reviewed at length with the patient today .  The patient does not  have concerns regarding medicines.

## 2023-06-17 DIAGNOSIS — I10 Essential (primary) hypertension: Secondary | ICD-10-CM | POA: Diagnosis not present

## 2023-06-17 DIAGNOSIS — Z1211 Encounter for screening for malignant neoplasm of colon: Secondary | ICD-10-CM | POA: Diagnosis not present

## 2023-06-17 DIAGNOSIS — Z79899 Other long term (current) drug therapy: Secondary | ICD-10-CM | POA: Diagnosis not present

## 2023-06-17 DIAGNOSIS — Z0001 Encounter for general adult medical examination with abnormal findings: Secondary | ICD-10-CM | POA: Diagnosis not present

## 2023-06-17 DIAGNOSIS — E7800 Pure hypercholesterolemia, unspecified: Secondary | ICD-10-CM | POA: Diagnosis not present

## 2023-06-17 DIAGNOSIS — D6869 Other thrombophilia: Secondary | ICD-10-CM | POA: Diagnosis not present

## 2023-06-17 DIAGNOSIS — I480 Paroxysmal atrial fibrillation: Secondary | ICD-10-CM | POA: Diagnosis not present

## 2023-07-21 DIAGNOSIS — Z83719 Family history of colon polyps, unspecified: Secondary | ICD-10-CM | POA: Diagnosis not present

## 2023-07-21 DIAGNOSIS — Z8679 Personal history of other diseases of the circulatory system: Secondary | ICD-10-CM | POA: Diagnosis not present

## 2023-07-21 DIAGNOSIS — Z7901 Long term (current) use of anticoagulants: Secondary | ICD-10-CM | POA: Diagnosis not present

## 2023-07-21 DIAGNOSIS — Z1211 Encounter for screening for malignant neoplasm of colon: Secondary | ICD-10-CM | POA: Diagnosis not present

## 2023-08-13 ENCOUNTER — Telehealth: Payer: Self-pay

## 2023-08-13 DIAGNOSIS — Z01818 Encounter for other preprocedural examination: Secondary | ICD-10-CM

## 2023-08-13 NOTE — Telephone Encounter (Signed)
Patient is scheduled on 2/17 with Otilio Saber, PA for pre-op clearance.

## 2023-08-13 NOTE — Telephone Encounter (Signed)
No Tele appointment available and send a message to EP to see if they could schedule him in person appt. Before produce 08/25/23, and in time labs as well on time.

## 2023-08-13 NOTE — Addendum Note (Signed)
Addended by: Tarri Fuller on: 08/13/2023 05:19 PM   Modules accepted: Orders

## 2023-08-13 NOTE — Telephone Encounter (Signed)
Per EP scheduler pt has been scheduled with Otilio Saber, [facility] 08/16/23, preop clearance. I will update all parties involved. Pt will need labs ordered as well for preop clearance.

## 2023-08-13 NOTE — Telephone Encounter (Signed)
   Pre-operative Risk Assessment    Patient Name: Devin Riggs  DOB: 09-26-1945 MRN: [id number]   Date of last office visit: 05/05/23 dr. Sherryl Manges Date of next office visit: None   Request for Surgical Clearance    Procedure:   Colonoscopy  Date of Surgery:  Clearance 08/25/23                                Surgeon:  Dr. Kerin Salen Surgeon's Group or Practice Name:  [facility] Phone number:  [phone] Fax number:  [phone]   Type of Clearance Requested:   - Medical  - Pharmacy:  Hold Apixaban (Eliquis) 2 days before procedure   Type of Anesthesia:  Not Indicated   Additional requests/questions:    Merrilee Jansky Bailey Kolbe   08/13/2023, 3:50 PM

## 2023-08-13 NOTE — Telephone Encounter (Signed)
Lab orders have been placed and released for [facility] Bmet/cbc.

## 2023-08-13 NOTE — Telephone Encounter (Signed)
Ok per preop APP to schedule in office if no open tele appt in time for procedure.

## 2023-08-13 NOTE — Telephone Encounter (Signed)
Callback team,  Patient will need updated CBC and BMET prior to guidance and clearance being granted for Eliquis.  He will also need a televisit for medical clearance.  Please let me know if have any further questions.  Thanks,

## 2023-08-15 NOTE — Progress Notes (Unsigned)
   Electrophysiology Office Note:   Date:  08/16/2023  ID:  Devin Riggs, DOB 1945-08-17, MRN [id number]  Primary Cardiologist: None Electrophysiologist: Sherryl Manges, MD      History of Present Illness:   [name] is a 78 y.o. male with h/o syncope and AFL s/p ablation 10/2020 seen today for cardiac clearance for colonoscopy   Since last being seen in our clinic the patient reports doing well. Denies symptomatic AF.  he denies chest pain, palpitations, dyspnea, PND, orthopnea, nausea, vomiting, dizziness, syncope, edema, weight gain, or early satiety.   Review of systems complete and found to be negative unless listed in HPI.   Studies Reviewed:    EKG is ordered today. Personal review as below.  EKG Interpretation Date/Time:  Monday August 16 2023 11:36:29 EST Ventricular Rate:  55 PR Interval:  196 QRS Duration:  96 QT Interval:  426 QTC Calculation: 407 R Axis:   -18  Text Interpretation: Sinus bradycardia with Premature atrial complexes When compared with ECG of 05-May-2023 16:02, Premature atrial complexes are now Present Confirmed by Maxine Glenn [phone]) on 08/16/2023 12:01:56 PM      Device History: Medtronic loop recorder implanted 10/2020 for syncope / Declined further remote monitoring after 03/2022  Physical Exam:   VS:  BP 108/70 (BP Location: Left Arm, Patient Position: Sitting, Cuff Size: Large)   Pulse (!) 47   Resp 16   Ht 5\' 8"  (1.727 m)   Wt 187 lb 6.4 oz (85 kg)   SpO2 97%   BMI 28.49 kg/m    Wt Readings from Last 3 Encounters:  08/16/23 187 lb 6.4 oz (85 kg)  05/05/23 182 lb (82.6 kg)  03/04/21 185 lb 12.8 oz (84.3 kg)     GEN: No acute distress NECK: No JVD; No carotid bruits CARDIAC: Regular rate and rhythm, no murmurs, rubs, gallops RESPIRATORY:  Clear to auscultation without rales, wheezing or rhonchi  ABDOMEN: Soft, non-tender, non-distended EXTREMITIES:  No edema; No deformity   ILR Interrogation- reviewed in  detail today,  See PACEART report  ASSESSMENT AND PLAN:    Atrial fibrillation s/p Medtronic Loop recorder Syncope Normal device function See Pace Art report. 1.1% burden, though some may represent ectopy.  Sensitivity changed to least sensitive to try and filter out ectopy. Already aggressive ectopy rejection.   HTN Stable on current regimen   Cardiac clearance for colonoscopy Pt OK to proceed without further cardiac work up Labs have been ordered.    Follow up with Dr. Graciela Husbands in the fall for annual visit (recall scheduled)  Signed, Graciella Freer, PA-C

## 2023-08-16 ENCOUNTER — Ambulatory Visit: Payer: Medicare Other | Attending: Student | Admitting: Student

## 2023-08-16 ENCOUNTER — Encounter: Payer: Self-pay | Admitting: Student

## 2023-08-16 VITALS — BP 108/70 | HR 47 | Resp 16 | Ht 68.0 in | Wt 187.4 lb

## 2023-08-16 DIAGNOSIS — Z01818 Encounter for other preprocedural examination: Secondary | ICD-10-CM | POA: Diagnosis not present

## 2023-08-16 DIAGNOSIS — I4892 Unspecified atrial flutter: Secondary | ICD-10-CM | POA: Diagnosis not present

## 2023-08-16 DIAGNOSIS — R55 Syncope and collapse: Secondary | ICD-10-CM | POA: Insufficient documentation

## 2023-08-16 LAB — CUP PACEART INCLINIC DEVICE CHECK
Date Time Interrogation Session: 20250217120236
Implantable Pulse Generator Implant Date: 20220502

## 2023-08-16 NOTE — Patient Instructions (Addendum)
Medication Instructions:  Your physician recommends that you continue on your current medications as directed. Please refer to the Current Medication list given to you today.  *If you need a refill on your cardiac medications before your next appointment, please call your pharmacy*  Lab Work: BMET, CBC-TODAY You will need to go to any [facility] for these labs. There is a [facility] in our building on the 1st floor or you can call [phone] or visit [email or web address] to find a lab near you. - You do NOT need an appointment for this. You do NOT need to be fasting. We NO LONGER have a lab located in our office.  If you have labs (blood work) drawn today and your tests are completely normal, you will receive your results only by: MyChart Message (if you have MyChart) OR A paper copy in the mail If you have any lab test that is abnormal or we need to change your treatment, we will call you to review the results.  Follow-Up: At [facility], you and your health needs are our priority.  As part of our continuing mission to provide you with exceptional heart care, we have created designated Provider Care Teams.  These Care Teams include your primary Cardiologist (physician) and Advanced Practice Providers (APPs -  Physician Assistants and Nurse Practitioners) who all work together to provide you with the care you need, when you need it.  Your next appointment:   9 month(s)  Provider:   Sherryl Manges, MD

## 2023-08-17 LAB — BASIC METABOLIC PANEL
BUN/Creatinine Ratio: 19 (ref 10–24)
BUN: 19 mg/dL (ref 8–27)
CO2: 23 mmol/L (ref 20–29)
Calcium: 8.9 mg/dL (ref 8.6–10.2)
Chloride: 104 mmol/L (ref 96–106)
Creatinine, Ser: 0.99 mg/dL (ref 0.76–1.27)
Glucose: 88 mg/dL (ref 70–99)
Potassium: 4.9 mmol/L (ref 3.5–5.2)
Sodium: 140 mmol/L (ref 134–144)
eGFR: 78 mL/min/{1.73_m2} (ref >59)

## 2023-08-17 LAB — CBC
Hematocrit: 45.6 % (ref 37.5–51.0)
Hemoglobin: 15.7 g/dL (ref 13.0–17.7)
MCH: 32.4 pg (ref 26.6–33.0)
MCHC: 34.4 g/dL (ref 31.5–35.7)
MCV: 94 fL (ref 79–97)
Platelets: 216 10*3/uL (ref 150–450)
RBC: 4.84 x10E6/uL (ref 4.14–5.80)
RDW: 12.2 % (ref 11.6–15.4)
WBC: 5.2 10*3/uL (ref 3.4–10.8)

## 2023-08-17 NOTE — Telephone Encounter (Signed)
Please advise holding Eliquis prior to colonoscopy on 08/25/2023  Thank you!  DW

## 2023-08-17 NOTE — Telephone Encounter (Signed)
Patient with diagnosis of afib on Eliquis for anticoagulation.    Procedure: Colonoscopy  Date of procedure: 08/25/23   CHA2DS2-VASc Score = 3   This indicates a 3.2% annual risk of stroke. The patient's score is based upon: CHF History: 0 HTN History: 1 Diabetes History: 0 Stroke History: 0 Vascular Disease History: 0 Age Score: 2 Gender Score: 0      CrCl 75 ml/min Platelet count 216  Per office protocol, patient can hold Eliquis for 2 days prior to procedure.    **This guidance is not considered finalized until pre-operative APP has relayed final recommendations.**

## 2023-08-17 NOTE — Telephone Encounter (Signed)
   Name: Devin Riggs  DOB: 07-13-1945  MRN: [id number]   Primary Cardiologist: None  Chart reviewed as part of pre-operative protocol coverage. Devin Riggs was last seen on 08/16/2023 by Otilio Saber, PA-C.  He felt patient was safe to proceed with procedure without further testing.   Per Pharm D, patient may hold Eliquis for 2 days prior to procedure.    I will route this recommendation to the requesting party via Epic fax function and remove from pre-op pool. Please call with questions.  Devin Levering, NP 08/17/2023, 12:57 PM

## 2023-08-25 DIAGNOSIS — D122 Benign neoplasm of ascending colon: Secondary | ICD-10-CM | POA: Diagnosis not present

## 2023-08-25 DIAGNOSIS — K5730 Diverticulosis of large intestine without perforation or abscess without bleeding: Secondary | ICD-10-CM | POA: Diagnosis not present

## 2023-08-25 DIAGNOSIS — D124 Benign neoplasm of descending colon: Secondary | ICD-10-CM | POA: Diagnosis not present

## 2023-08-25 DIAGNOSIS — K648 Other hemorrhoids: Secondary | ICD-10-CM | POA: Diagnosis not present

## 2023-08-25 DIAGNOSIS — Z1211 Encounter for screening for malignant neoplasm of colon: Secondary | ICD-10-CM | POA: Diagnosis not present

## 2023-08-25 DIAGNOSIS — D123 Benign neoplasm of transverse colon: Secondary | ICD-10-CM | POA: Diagnosis not present

## 2023-08-25 DIAGNOSIS — Z800 Family history of malignant neoplasm of digestive organs: Secondary | ICD-10-CM | POA: Diagnosis not present

## 2023-11-17 DIAGNOSIS — K648 Other hemorrhoids: Secondary | ICD-10-CM | POA: Diagnosis not present

## 2023-11-17 DIAGNOSIS — I10 Essential (primary) hypertension: Secondary | ICD-10-CM | POA: Diagnosis not present

## 2024-03-15 DIAGNOSIS — H25813 Combined forms of age-related cataract, bilateral: Secondary | ICD-10-CM | POA: Diagnosis not present

## 2024-03-15 DIAGNOSIS — H35362 Drusen (degenerative) of macula, left eye: Secondary | ICD-10-CM | POA: Diagnosis not present

## 2024-03-15 DIAGNOSIS — H35371 Puckering of macula, right eye: Secondary | ICD-10-CM | POA: Diagnosis not present

## 2024-03-30 DIAGNOSIS — Z85828 Personal history of other malignant neoplasm of skin: Secondary | ICD-10-CM | POA: Diagnosis not present

## 2024-03-30 DIAGNOSIS — C4441 Basal cell carcinoma of skin of scalp and neck: Secondary | ICD-10-CM | POA: Diagnosis not present

## 2024-04-04 DIAGNOSIS — Z23 Encounter for immunization: Secondary | ICD-10-CM | POA: Diagnosis not present

## 2024-04-28 ENCOUNTER — Other Ambulatory Visit: Payer: Self-pay | Admitting: Student

## 2024-04-28 DIAGNOSIS — Z8679 Personal history of other diseases of the circulatory system: Secondary | ICD-10-CM

## 2024-04-28 MED ORDER — APIXABAN 5 MG PO TABS: 5.0000 mg | ORAL_TABLET | Freq: Two times a day (BID) | ORAL | 3 refills | Status: AC

## 2024-04-28 NOTE — Telephone Encounter (Signed)
 Prescription refill request for Eliquis  received. Indication:aflutter Last office visit:2/25 Scr:0.99  2/25 Age: 78 Weight:85  kg  Prescription refilled
